# Patient Record
Sex: Male | Born: 1990
Health system: Southern US, Community
[De-identification: ages and names within clinical notes are randomized; demographics above are authoritative.]

## PROBLEM LIST (undated history)

## (undated) DIAGNOSIS — W3400XA Accidental discharge from unspecified firearms or gun, initial encounter: Secondary | ICD-10-CM

## (undated) HISTORY — PX: HERNIA REPAIR: SHX51

---
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
## 2010-05-28 ENCOUNTER — Emergency Department (HOSPITAL_BASED_OUTPATIENT_CLINIC_OR_DEPARTMENT_OTHER): Admission: EM | Admit: 2010-05-28 | Discharge: 2010-05-28 | Payer: Self-pay | Admitting: Emergency Medicine

## 2010-07-17 ENCOUNTER — Ambulatory Visit: Payer: Self-pay | Admitting: Diagnostic Radiology

## 2010-07-17 ENCOUNTER — Ambulatory Visit (HOSPITAL_BASED_OUTPATIENT_CLINIC_OR_DEPARTMENT_OTHER): Admission: RE | Admit: 2010-07-17 | Discharge: 2010-07-17 | Payer: Self-pay | Admitting: Internal Medicine

## 2010-12-08 ENCOUNTER — Emergency Department (HOSPITAL_BASED_OUTPATIENT_CLINIC_OR_DEPARTMENT_OTHER)
Admission: EM | Admit: 2010-12-08 | Discharge: 2010-12-08 | Payer: Self-pay | Source: Home / Self Care | Admitting: Emergency Medicine

## 2011-03-08 LAB — URINALYSIS, ROUTINE W REFLEX MICROSCOPIC
Bilirubin Urine: NEGATIVE
Ketones, ur: NEGATIVE mg/dL
Protein, ur: 100 mg/dL — AB
Specific Gravity, Urine: 1.012 (ref 1.005–1.030)
pH: 7.5 (ref 5.0–8.0)

## 2011-03-08 LAB — URINE MICROSCOPIC-ADD ON

## 2011-03-08 LAB — GC/CHLAMYDIA PROBE AMP, GENITAL: GC Probe Amp, Genital: NEGATIVE

## 2011-05-07 NOTE — Op Note (Signed)
   NAME:  Jacob Velazquez, Jacob Velazquez                            ACCOUNT NO.:  [id number]   MEDICAL RECORD NO.:  [id number]                   PATIENT TYPE:  AMB   LOCATION:  DSC                                  FACILITY:  [facility]   PHYSICIAN:  Prabhakar D. Pendse, M.D.           DATE OF BIRTH:  [birth year]   DATE OF PROCEDURE:  DATE OF DISCHARGE:                                 OPERATIVE REPORT   PREOPERATIVE DIAGNOSIS:  1. Right indirect inguinal hernia.  2. Status post left orchiopexy.   POSTOPERATIVE DIAGNOSIS:  1. Right indirect inguinal hernia.  2. Status post left orchiopexy.   OPERATION:  Repair of right indirect inguinal hernia.   SURGEON:  Prabhakar D. Levie Heritage, M.D.   ASSISTANT:  Nurse.   ANESTHESIA:  General.   DESCRIPTION OF PROCEDURE:  With the patient in the supine position the  abdominal and groin regions were  thoroughly prepped and draped in the usual  sterile manner. About a 3 cm long transverse incision was made in the right  groin in the distal skin crease. The skin and subcutaneous tissue was  incised and bleeder were clamped, cut and electrocoagulated.   The external oblique was opened. The spermatic cord structures were  dissected to isolate the indirect inguinal hernia sac. The sac was isolated  up to its high point and doubly suture ligated with 4-0 silk and the excess  of the sac was excised. The testicle was placed in the right scrotal pouch.   The hernia repair was carried out with modified Ferguson's method with a 35  wire into the sutures. Then 0.25% Marcaine with epinephrine was injected  locally for postoperative analgesia. The subcutaneous tissue was closed with  4-0 Vicryl. The skin was closed with 5-0 Monocryl subcuticular suture. Steri-  Strips were applied.   Throughout the patient's vital signs remained stable. The patient withstood  the procedure well and was transferred to the recovery room in satisfactory  general condition.                                     Prabhakar D. Levie Heritage, M.D.    PDP/MEDQ  D:  11/07/2002  T:  11/07/2002  Job:  [id number]   cc:   Austin Miles, M.D.  [address]  [place], [place] [address]  Fax: [phone]

## 2012-07-15 ENCOUNTER — Encounter (HOSPITAL_BASED_OUTPATIENT_CLINIC_OR_DEPARTMENT_OTHER): Payer: Self-pay | Admitting: *Deleted

## 2012-07-15 ENCOUNTER — Emergency Department (HOSPITAL_BASED_OUTPATIENT_CLINIC_OR_DEPARTMENT_OTHER)
Admission: EM | Admit: 2012-07-15 | Discharge: 2012-07-15 | Disposition: A | Discharge status: Other Acute Inpatient Hospital | Payer: Self-pay | Attending: Emergency Medicine | Admitting: Emergency Medicine

## 2012-07-15 ENCOUNTER — Emergency Department (HOSPITAL_BASED_OUTPATIENT_CLINIC_OR_DEPARTMENT_OTHER): Payer: Self-pay

## 2012-07-15 DIAGNOSIS — S025XXA Fracture of tooth (traumatic), initial encounter for closed fracture: Secondary | ICD-10-CM | POA: Insufficient documentation

## 2012-07-15 DIAGNOSIS — M2679 Other specified alveolar anomalies: Secondary | ICD-10-CM | POA: Insufficient documentation

## 2012-07-15 DIAGNOSIS — S0993XA Unspecified injury of face, initial encounter: Secondary | ICD-10-CM | POA: Insufficient documentation

## 2012-07-15 DIAGNOSIS — S199XXA Unspecified injury of neck, initial encounter: Secondary | ICD-10-CM | POA: Insufficient documentation

## 2012-07-15 DIAGNOSIS — S01501A Unspecified open wound of lip, initial encounter: Secondary | ICD-10-CM | POA: Insufficient documentation

## 2012-07-15 DIAGNOSIS — M2670 Unspecified alveolar anomaly: Secondary | ICD-10-CM

## 2012-07-15 MED ORDER — TRAMADOL HCL 50 MG PO TABS
50.0000 mg | ORAL_TABLET | Freq: Once | ORAL | Status: AC
  Administered 2012-07-15: 50 mg via ORAL
  Filled 2012-07-15: qty 1

## 2012-07-15 MED ORDER — CEFAZOLIN SODIUM 1-5 GM-% IV SOLN
1.0000 g | Freq: Once | INTRAVENOUS | Status: AC
  Administered 2012-07-15: 1 g via INTRAVENOUS
  Filled 2012-07-15: qty 50

## 2012-07-15 NOTE — ED Notes (Signed)
Dr Nicanor Alcon informed pt that she is still attempting to contact oral surgeons to make pt a follow-up appt. Pt aware and agrees with plan of care.

## 2012-07-15 NOTE — ED Notes (Signed)
MD at bedside. 
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
## 2012-07-15 NOTE — ED Notes (Signed)
Patient transported to CT 

## 2012-07-15 NOTE — ED Notes (Signed)
MD at bedside for laceration repair to upper lip.
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
## 2012-07-15 NOTE — ED Notes (Signed)
Pt return from CT in NAD. [employer] at bs.

## 2012-07-15 NOTE — ED Notes (Signed)
Pt report given to Rosanne Ashing, RN in ED at [facility]. Awaiting arrival of [facility] for pt transport.

## 2012-07-15 NOTE — ED Notes (Signed)
Pt report given to Jason, RN with [facility]. 

## 2015-04-14 ENCOUNTER — Emergency Department (HOSPITAL_BASED_OUTPATIENT_CLINIC_OR_DEPARTMENT_OTHER)
Admission: EM | Admit: 2015-04-14 | Discharge: 2015-04-14 | Disposition: A | Discharge status: Home / Self Care | Payer: Self-pay | Attending: Emergency Medicine | Admitting: Emergency Medicine

## 2015-04-14 ENCOUNTER — Encounter (HOSPITAL_BASED_OUTPATIENT_CLINIC_OR_DEPARTMENT_OTHER): Payer: Self-pay | Admitting: Emergency Medicine

## 2015-04-14 DIAGNOSIS — Z720 Tobacco use: Secondary | ICD-10-CM | POA: Insufficient documentation

## 2015-04-14 DIAGNOSIS — K029 Dental caries, unspecified: Secondary | ICD-10-CM | POA: Insufficient documentation

## 2015-04-14 MED ORDER — PENICILLIN V POTASSIUM 500 MG PO TABS
500.0000 mg | ORAL_TABLET | Freq: Four times a day (QID) | ORAL | Status: AC
Start: 2015-04-14 — End: 2015-04-21

## 2015-04-14 MED ORDER — NAPROXEN 250 MG PO TABS
500.0000 mg | ORAL_TABLET | Freq: Once | ORAL | Status: AC
  Administered 2015-04-14: 500 mg via ORAL
  Filled 2015-04-14: qty 2

## 2015-04-14 MED ORDER — PENICILLIN V POTASSIUM 250 MG PO TABS
500.0000 mg | ORAL_TABLET | Freq: Once | ORAL | Status: AC
  Administered 2015-04-14: 500 mg via ORAL
  Filled 2015-04-14: qty 2

## 2015-04-14 MED ORDER — MELOXICAM 15 MG PO TABS: 15.0000 mg | ORAL_TABLET | Freq: Every day | ORAL | Status: DC

## 2015-04-14 NOTE — Discharge Instructions (Signed)
Dental Care and Dentist Visits °Dental care supports good overall health. Regular dental visits can also help you avoid dental pain, bleeding, infection, and other more serious health problems in the future. It is important to keep the mouth healthy because diseases in the teeth, gums, and other oral tissues can spread to other areas of the body. Some problems, such as diabetes, heart disease, and pre-term labor have been associated with poor oral health.  °See your dentist every 6 months. If you experience emergency problems such as a toothache or broken tooth, go to the dentist right away. If you see your dentist regularly, you may catch problems early. It is easier to be treated for problems in the early stages.  °WHAT TO EXPECT AT A DENTIST VISIT  °Your dentist will look for many common oral health problems and recommend proper treatment. At your regular dental visit, you can expect: °· Gentle cleaning of the teeth and gums. This includes scraping and polishing. This helps to remove the sticky substance around the teeth and gums (plaque). Plaque forms in the mouth shortly after eating. Over time, plaque hardens on the teeth as tartar. If tartar is not removed regularly, it can cause problems. Cleaning also helps remove stains. °· Periodic X-rays. These pictures of the teeth and supporting bone will help your dentist assess the health of your teeth. °· Periodic fluoride treatments. Fluoride is a natural mineral shown to help strengthen teeth. Fluoride treatment involves applying a fluoride gel or varnish to the teeth. It is most commonly done in children. °· Examination of the mouth, tongue, jaws, teeth, and gums to look for any oral health problems, such as: °¨ Cavities (dental caries). This is decay on the tooth caused by plaque, sugar, and acid in the mouth. It is best to catch a cavity when it is small. °¨ Inflammation of the gums caused by plaque buildup (gingivitis). °¨ Problems with the mouth or malformed  or misaligned teeth. °¨ Oral cancer or other diseases of the soft tissues or jaws.  °KEEP YOUR TEETH AND GUMS HEALTHY °For healthy teeth and gums, follow these general guidelines as well as your dentist's specific advice: °· Have your teeth professionally cleaned at the dentist every 6 months. °· Brush twice daily with a fluoride toothpaste. °· Floss your teeth daily.  °· Ask your dentist if you need fluoride supplements, treatments, or fluoride toothpaste. °· Eat a healthy diet. Reduce foods and drinks with added sugar. °· Avoid smoking. °TREATMENT FOR ORAL HEALTH PROBLEMS °If you have oral health problems, treatment varies depending on the conditions present in your teeth and gums. °· Your caregiver will most likely recommend good oral hygiene at each visit. °· For cavities, gingivitis, or other oral health disease, your caregiver will perform a procedure to treat the problem. This is typically done at a separate appointment. Sometimes your caregiver will refer you to another dental specialist for specific tooth problems or for surgery. °SEEK IMMEDIATE DENTAL CARE IF: °· You have pain, bleeding, or soreness in the gum, tooth, jaw, or mouth area. °· A permanent tooth becomes loose or separated from the gum socket. °· You experience a blow or injury to the mouth or jaw area. °Document Released: 08/18/2011 Document Revised: 02/28/2012 Document Reviewed: 08/18/2011 °ExitCare® Patient Information ©2015 [employer]. This information is not intended to replace advice given to you by your health care provider. Make sure you discuss any questions you have with your health care provider. ° °Emergency Department Resource Guide °1) Find a Doctor   and Pay Out of Pocket °Although you won't have to find out who is covered by your insurance plan, it is a good idea to ask around and get recommendations. You will then need to call the office and see if the doctor you have chosen will accept you as a new patient and what types of  options they offer for patients who are self-pay. Some doctors offer discounts or will set up payment plans for their patients who do not have insurance, but you will need to ask so you aren't surprised when you get to your appointment. ° °2) Contact Your Local Health Department °Not all health departments have doctors that can see patients for sick visits, but many do, so it is worth a call to see if yours does. If you don't know where your local health department is, you can check in your phone book. The CDC also has a tool to help you locate your state's health department, and many state websites also have listings of all of their local health departments. ° °3) Find a Walk-in Clinic °If your illness is not likely to be very severe or complicated, you may want to try a walk in clinic. These are popping up all over the country in pharmacies, drugstores, and shopping centers. They're usually staffed by nurse practitioners or physician assistants that have been trained to treat common illnesses and complaints. They're usually fairly quick and inexpensive. However, if you have serious medical issues or chronic medical problems, these are probably not your best option. ° °No Primary Care Doctor: °- Call [facility] at  832-8000 - they can help you locate a primary care doctor that  accepts your insurance, provides certain services, etc. °- Physician Referral Service- 1-800-533-3463 ° °Chronic Pain Problems: °Organization         Address  Phone   Notes  °[facility]  (336) 297-2271 Patients need to be referred by their primary care doctor.  ° °Medication Assistance: °Organization         Address  Phone   Notes  °[place] [facility] [address] °[place], [place] [address] (336) 641-8030 --Must be a resident of [place] °-- Must have NO insurance coverage whatsoever (no Medicaid/ Medicare, etc.) °-- The pt. MUST have a primary care doctor that directs  their care regularly and follows them in the community °  °[facility]  (866) 331-1348   °[employer]  (888) 892-1162   ° °Agencies that provide inexpensive medical care: °Organization         Address  Phone   Notes  °[facility]  (336) 832-8035   °[facility]    (336) 832-7272   °[facility] [address] °[place], [place] [address] (336) 832-4777   °[facility] [address], °[place] (336) 271-4999   °[facility]    (336) 373-0678   °[facility]    (336) 272-1050   °[facility] ° [address], [place] Phone:  (336) 832-4444, Fax:  (336) 832-4440 Hours of Operation:  9 am - 6 pm, M-F.  Also accepts Medicaid/Medicare and self-pay.  °[facility] ° [address], [place] Phone: (336) 832-3150, Fax: (336) 832-3151. Hours of Operation:  8:30 am - 5:30 pm, M-F.  Also accepts Medicaid and self-pay.  °[facility] [place] [address], [place] Phone: (336) 878-6027   °  [facility] [address], [place], [place] (336)723-1848, Ext. 123 Mondays & Thursdays: 7-9 AM.  First 15 patients are seen on a first come, first serve basis. °  ° °Medicaid-accepting [place] Providers: ° °Organization         Address  Phone   Notes  °[facility] [address], [place] (336) 641-2100 Also accepts self-pay patients.  °[facility] [address], [place] ° (336) 856-9996   °[facility] [address], [place] (336) 288-8857   °[facility] [address] [place] [address], [place] (336) 299-7000   °Veita Bland [address], [place]  ° (336) 373-1557 Only accepts [place] Access Medicaid patients after they have their name applied to their card.  ° °Self-Pay (no insurance) in [place]: ° °Organization          Address  Phone   Notes  °Sickle Cell Patients, [facility] [address], [place] (336) 832-1970   °[facility] [address], [place] (336) 832-4400   °[facility] ° [address], [place] (336) 992-4800   °[facility]/Dr. Osei-Bonsu ° [address] [place] [address], [place] or [address], [place] (336) [phone] Phone number for both [place] and [place] locations is the same.  °[facility] [address], [place] (336) 299-0000   °[facility] [address] [place] [address], [place] or [address] (336) 852-7530 °(336) 878-2260   °[facility] [address], [place] (336) 350-1642, phone; (336) 294-5005, fax Sees patients 1st and 3rd Saturday of every month.  Must not qualify for public or private insurance (i.e. Medicaid, Medicare, [place] Health Choice, Veterans' Benefits) • Household income should be no more than 200% of the poverty level •The clinic cannot treat you if you are pregnant or think you are pregnant • Sexually transmitted diseases are not treated at the clinic.  ° ° °Dental Care: °Organization         Address  Phone  Notes  °[facility] [address], [place] (336) 641-6152 Accepts children up to age 21 who are enrolled in Medicaid or [place] Health Choice; pregnant women with a Medicaid card; and children who have applied for Medicaid or [place] Health Choice, but were declined, whose parents can pay a reduced fee at time of service.  °[facility] [place]  [address], [place] (336) 641-7733 Accepts children up to age 21 who are enrolled in Medicaid or [place] Health Choice; pregnant women with a Medicaid card; and children who have applied for Medicaid or [place] Health Choice, but were declined, whose parents can pay a reduced fee at time of  service.  °[facility] ° [address], [place] (336) 641-4533 Patients are seen by appointment only. Walk-ins are not accepted. [facility] will see patients 18 years of age and older. °Monday - Tuesday (8am-5pm) °Most Wednesdays (8:30-5pm) °$30 per visit, cash only  °[facility] ° [address], [place] (336) 641-4533 Patients are seen by appointment only. Walk-ins are not accepted. [facility] will see patients 18 years of age and older. °One Wednesday Evening (Monthly: Volunteer Based).  $30 per visit,   cash only  °[facility]  (919) 537-3737 for adults; Children under age 4, call Graduate Pediatric Dentistry at (919) [phone]. Children aged 4-14, please call (919) [phone] to request a pediatric application. ° Dental services are provided in all areas of dental care including fillings, crowns and bridges, complete and partial dentures, implants, gum treatment, root canals, and extractions. Preventive care is also provided. Treatment is provided to both adults and children. °Patients are selected via a lottery and there is often a waiting list. °  °[facility] [address], °[place] ° (336) 763-8833 [email or web address] °  °[facility] [address], [place], [place] (336)723-1848, Ext. 123 Second and Fourth Thursday of each month, opens at 6:30 AM; Clinic ends at 9 AM.  Patients are seen on a first-come first-served basis, and a limited number are seen during each clinic.  ° °[facility] ° [address], [place], [place] (336) 723-7904   Eligibility Requirements °You must have lived in [place], [place], or [place] counties for at least the last three months. °  You cannot be eligible for state or federal sponsored healthcare insurance, including Veterans Administration, Medicaid, or Medicare. °  You generally cannot be eligible for healthcare insurance through your employer.   °  How to apply: °Eligibility screenings are held every Tuesday and Wednesday afternoon from 1:00 pm until 4:00 pm. You do not need an appointment for the interview!  °[facility] [address], [place], [place] 336-631-2330   °[facility]  336-342-8273   °[facility]  336-703-3100   °[facility]  336-570-6415   ° °Behavioral Health Resources in the Community: °Intensive Outpatient Programs °Organization         Address  Phone  Notes  °[facility] [address], [place], [place] 336-878-6098   °[facility] [address], [place], [place] 336-832-9800   °[facility] [address], [place], [place] ° 336-882-2125   °[facility] [address],  °[place], [place] 1-800-853-5163 or 336-641-4981   °Substance Abuse Resources °Organization         Address  Phone  Notes  °[facility]  336-882-2125   °[facility]  336-784-9470   °[facility]  336-285-9073   °[facility]  336-845-3988   °Residential & Outpatient Substance Abuse Program  1-800-659-3381   °Psychological Services °Organization         Address  Phone  Notes  °[facility]  336- 832-9600   °[facility]  336- 378-7881   °[facility] [address], [place] 1-800-853-5163 or 336-641-4981   ° °Mobile Crisis Teams °Organization         Address  Phone  Notes  °[facility], Mobile Crisis Care Unit  1-877-626-1772   °Assertive °[facility] ° [address] [place], [place] 336-834-9664   °Sharon DeEsch [address] °[place] 336-554-5454   ° °Self-Help/Support Groups °Organization         Address  Phone             Notes  °[facility] - variety of support groups  336- 373-1402 Call for more information  °Narcotics Anonymous (NA), [facility] [address], °[place]  2 meetings at this location  ° °Residential Treatment Programs °Organization           Address  Phone  Notes  °[facility] [address],    °[place]  1-866-801-8205   °[facility] ° [address], [place], [place] 704-293-8524   °[facility] [address], [place] 336-845-3988 Admissions: 8am-3pm M-F  °[facility] [address]    °[place], [place] 336-841-1104   °[facility] [address], [place], [place] 336-379-7146   °[facility] [address]  °[place], [place] 336-285-9073   °[facility] - Intensive Outpatient [address], [place], [place] 336-852-3033   °[facility] [address]  °[place], [place] 1-877-615-2722 or 336-784-9470   °[facility] [address] [place], [place] 336-227-7417 Accepts Medicaid  °[facility] [address]  °[place] 1-800-659-3381 Substance Abuse/Addiction Treatment  ° °[place] Behavioral Health Resources °Organization         Address  Phone  Notes  °[facility]  (888) 581-9988   °Julie Brannon, PhD [address] [place], [place]   (336) 349-5553 or (336) 951-0000   °[facility]   [address] °[place], [place] (336) 349-4454   °[facility] [address], [place], [place] (336) 342-8316 Insurance/Medicaid/sponsorship through [facility]  °[facility] [address]                                    [place], [place] (336) 342-8316 Therapy/tele-psych/case  °[facility] [address]  ° [place],  (336) 349-2233    °Dr. Arfeen  (336) 349-4544   °[facility] [place]  [employer] [place] [facility] 1) [address], [place] °2) [address], [place] °3)  [address], [place] (336) 349-3220 °(336) 342-7768 ° °(336) 342-8140   °[place] [facility]  Hotline (336) 342-1394 or (336) 342-3537 (After Hours)    ° °

## 2015-04-14 NOTE — ED Notes (Signed)
Pt reports  Dental pain intermittant for a long time but flare up of pain x 1 week this episode of upper right wisdom tooth

## 2015-04-14 NOTE — ED Provider Notes (Signed)
CSN: [id number]     Arrival date & time 04/14/15  0054 History   First MD Initiated Contact with Patient 04/14/15 0241     Chief Complaint  Patient presents with  . Dental Pain     (Consider location/radiation/quality/duration/timing/severity/associated sxs/prior Treatment) Patient is a 24 y.o. male presenting with tooth pain. The history is provided by the patient.  Dental Pain Location:  Upper Upper teeth location:  1/RU 3rd molar Quality:  Dull Severity:  Severe Onset quality:  Sudden Duration:  2 days Timing:  Constant Progression:  Unchanged Context: not trauma   Previous work-up:  Dental exam Relieved by:  Nothing Worsened by:  Nothing tried Ineffective treatments:  None tried Associated symptoms: no drooling, no fever and no trismus   Risk factors: smoking     History reviewed. No pertinent past medical history. Past Surgical History  Procedure Laterality Date  . Hernia repair     History reviewed. No pertinent family history. History  Substance Use Topics  . Smoking status: Current Every Day Smoker  . Smokeless tobacco: Not on file  . Alcohol Use: Yes    Review of Systems  Constitutional: Negative for fever.  HENT: Negative for drooling.   All other systems reviewed and are negative.     Allergies  Review of patient's allergies indicates no known allergies.  Home Medications   Prior to Admission medications   Medication Sig Start Date End Date Taking? Authorizing Provider  meloxicam (MOBIC) 15 MG tablet Take 1 tablet (15 mg total) by mouth daily. 04/14/15   Darriel Utter, MD  penicillin v potassium (VEETID) 500 MG tablet Take 1 tablet (500 mg total) by mouth 4 (four) times daily. 04/14/15 04/21/15  Schelly Chuba, MD   BP 133/69 mmHg  Pulse 81  Temp(Src) 98.7 F (37.1 C) (Oral)  Resp 18  SpO2 98% Physical Exam  Constitutional: He is oriented to person, place, and time. He appears well-developed and well-nourished. No distress.  HENT:  Head:  Normocephalic and atraumatic.  Mouth/Throat: Oropharynx is clear and moist. No trismus in the jaw. No oropharyngeal exudate.    Eyes: Conjunctivae and EOM are normal. Pupils are equal, round, and reactive to light.  Neck: Normal range of motion. Neck supple.  Cardiovascular: Normal rate, regular rhythm and intact distal pulses.   Pulmonary/Chest: Effort normal and breath sounds normal. No respiratory distress. He has no wheezes. He has no rales.  Abdominal: Soft. Bowel sounds are normal. There is no tenderness. There is no rebound and no guarding.  Musculoskeletal: Normal range of motion.  Lymphadenopathy:    He has no cervical adenopathy.  Neurological: He is alert and oriented to person, place, and time.  Skin: Skin is warm and dry.  Psychiatric: He has a normal mood and affect.    ED Course  Procedures (including critical care time) Labs Review Labs Reviewed - No data to display  Imaging Review No results found.   EKG Interpretation None      MDM   Final diagnoses:  Dental caries    Follow up with dentistry penicillin and mobic    Arcenio Mullaly, MD 04/14/15 [phone]

## 2016-08-04 ENCOUNTER — Emergency Department (HOSPITAL_BASED_OUTPATIENT_CLINIC_OR_DEPARTMENT_OTHER)
Admission: EM | Admit: 2016-08-04 | Discharge: 2016-08-05 | Disposition: A | Discharge status: Home / Self Care | Payer: Self-pay | Attending: Emergency Medicine | Admitting: Emergency Medicine

## 2016-08-04 ENCOUNTER — Encounter (HOSPITAL_BASED_OUTPATIENT_CLINIC_OR_DEPARTMENT_OTHER): Payer: Self-pay | Admitting: *Deleted

## 2016-08-04 DIAGNOSIS — Z791 Long term (current) use of non-steroidal anti-inflammatories (NSAID): Secondary | ICD-10-CM | POA: Insufficient documentation

## 2016-08-04 DIAGNOSIS — F17200 Nicotine dependence, unspecified, uncomplicated: Secondary | ICD-10-CM | POA: Insufficient documentation

## 2016-08-04 DIAGNOSIS — K029 Dental caries, unspecified: Secondary | ICD-10-CM | POA: Insufficient documentation

## 2016-08-04 NOTE — ED Notes (Signed)
Poison control notified of overdose, no suggestions or concerns

## 2016-08-04 NOTE — ED Triage Notes (Addendum)
Pt states he took flexeril 10mg  x 2 tabs and vicodin 5 mg x 1 tab and motrin 800 mg x 1 tab 2 hrs ago for dental pain , denies any symptoms just worried he took tomuch

## 2016-08-05 MED ORDER — HYDROCODONE-ACETAMINOPHEN 5-325 MG PO TABS: 1.0000 | ORAL_TABLET | Freq: Four times a day (QID) | ORAL | 0 refills | Status: DC | PRN

## 2016-08-05 MED ORDER — AMOXICILLIN 500 MG PO CAPS: 500.0000 mg | ORAL_CAPSULE | Freq: Three times a day (TID) | ORAL | 0 refills | Status: DC

## 2016-08-05 NOTE — Discharge Instructions (Signed)
Take acetaminophen and ibuprofen for your main pain control. See a dentist as soon as possible - your teeth will not get better on their own.

## 2016-08-05 NOTE — ED Notes (Signed)
MD at bedside. 

## 2016-08-05 NOTE — ED Provider Notes (Signed)
[facility]   CSN: [id number] Arrival date & time: 08/04/16  2328     History   Chief Complaint Chief Complaint  Patient presents with  . Drug Overdose    HPI Jacob Velazquez is a 25 y.o. male.  The history is provided by the patient.  Drug Overdose   He has been having pain in the left lower molar for about one month. He was in severe pain tonight, so he took some medication which had been prescribed for his back. He took ibuprofen 800 mg, cyclobenzaprine 20 mg (210 mg tablets), and hydrocodone-acetaminophen 5-325. This was done at about 8 PM. He then was concerned that he may taken too much medication so he came to the ED. He has not made an appointment with a dentist but was planning to try to be seen as a walk-in tomorrow.  History reviewed. No pertinent past medical history.  There are no active problems to display for this patient.   Past Surgical History:  Procedure Laterality Date  . HERNIA REPAIR         Home Medications    Prior to Admission medications   Medication Sig Start Date End Date Taking? Authorizing Provider  cyclobenzaprine (FLEXERIL) 10 MG tablet Take 10 mg by mouth 3 (three) times daily as needed for muscle spasms.   Yes Historical Provider, MD  HYDROcodone-acetaminophen (NORCO/VICODIN) 5-325 MG tablet Take 1 tablet by mouth every 6 (six) hours as needed for moderate pain.   Yes Historical Provider, MD  ibuprofen (ADVIL,MOTRIN) 800 MG tablet Take 800 mg by mouth every 8 (eight) hours as needed.   Yes Historical Provider, MD    Family History No family history on file.  Social History Social History  Substance Use Topics  . Smoking status: Current Every Day Smoker    Packs/day: 0.50  . Smokeless tobacco: Not on file  . Alcohol use Yes     Allergies   Review of patient's allergies indicates no known allergies.   Review of Systems Review of Systems  All other systems reviewed and are negative.    Physical  Exam Updated Vital Signs BP 125/83   Pulse 70   Temp 98.2 F (36.8 C) (Oral)   Resp 16   Ht 5\' 11"  (1.803 m)   Wt 145 lb (65.8 kg)   SpO2 100%   BMI 20.22 kg/m   Physical Exam  Nursing note and vitals reviewed.  25 year old male, resting comfortably and in no acute distress. Vital signs are normal. Oxygen saturation is 100%, which is normal. Head is normocephalic and atraumatic. PERRLA, EOMI. Oropharynx is clear. Neck is nontender and supple without adenopathy or JVD. Tooth #32 is severely carious and rotted down to about the gingival line. There is also a moderate size Tennille carry on tooth #31. Back is nontender and there is no CVA tenderness. Lungs are clear without rales, wheezes, or rhonchi. Chest is nontender. Heart has regular rate and rhythm without murmur. Abdomen is soft, flat, nontender without masses or hepatosplenomegaly and peristalsis is normoactive. Extremities have no cyanosis or edema, full range of motion is present. Skin is warm and dry without rash. Neurologic: He is sleepy but arousable, cranial nerves are intact, there are no motor or sensory deficits.  ED Treatments / Results   Procedures Procedures (including critical care time)  Medications Ordered in ED Medications - No data to display   Initial Impression / Assessment and Plan / ED Course  I  have reviewed the triage vital signs and the nursing notes.  Pertinent labs & imaging results that were available during my care of the patient were reviewed by me and considered in my medical decision making (see chart for details).  Clinical Course    Overmedication for dental pain. The only medication that was taken above the recommended dose was cyclobenzaprine. Poison control was consulted and there are no concerns for the medication she took in the amount that he took. No indication for laboratory testing and no need for observation. He is supposed to go to work at 5 AM and I am concerned that he  may be too drowsy because of the cyclobenzaprine that he took. He is given a work note. He is given dental resources and advised that he will need the services of a dentist to adequately address his pain. He is given a prescription for amoxicillin. The hydrocodone tablet he took was his last one. Is given a prescription for 10 more.  Final Clinical Impressions(s) / ED Diagnoses   Final diagnoses:  Dental caries    New Prescriptions Discharge Medication List as of 08/05/2016 12:44 AM    START taking these medications   Details  amoxicillin (AMOXIL) 500 MG capsule Take 1 capsule (500 mg total) by mouth 3 (three) times daily., Starting Thu 08/05/2016, Print         Dione Boozeavid Alim Cattell, MD 08/05/16 [phone]

## 2017-09-12 ENCOUNTER — Emergency Department (HOSPITAL_BASED_OUTPATIENT_CLINIC_OR_DEPARTMENT_OTHER)
Admission: EM | Admit: 2017-09-12 | Discharge: 2017-09-12 | Disposition: A | Discharge status: Home / Self Care | Payer: Self-pay | Attending: Emergency Medicine | Admitting: Emergency Medicine

## 2017-09-12 ENCOUNTER — Encounter (HOSPITAL_BASED_OUTPATIENT_CLINIC_OR_DEPARTMENT_OTHER): Payer: Self-pay | Admitting: Emergency Medicine

## 2017-09-12 DIAGNOSIS — F17200 Nicotine dependence, unspecified, uncomplicated: Secondary | ICD-10-CM | POA: Insufficient documentation

## 2017-09-12 DIAGNOSIS — Z202 Contact with and (suspected) exposure to infections with a predominantly sexual mode of transmission: Secondary | ICD-10-CM | POA: Insufficient documentation

## 2017-09-12 NOTE — ED Provider Notes (Signed)
[facility]   CSN: [id number] Arrival date & time: 09/12/17  1003     History   Chief Complaint Chief Complaint  Patient presents with  . Exposure to STD    HPI Jacob Velazquez is a 26 y.o. male.  HPI Patient said 26 year male who presents to the emergency department reports that he needs to be checked for STDs.  He is without urethral symptoms.  No discharge.  No abdominal pain.  Denies nausea vomiting.  His significant other is in the emergency department being seen for new vaginal discharge and vaginal odor.   History reviewed. No pertinent past medical history.  There are no active problems to display for this patient.   Past Surgical History:  Procedure Laterality Date  . HERNIA REPAIR         Home Medications    Prior to Admission medications   Medication Sig Start Date End Date Taking? Authorizing Provider  amoxicillin (AMOXIL) 500 MG capsule Take 1 capsule (500 mg total) by mouth 3 (three) times daily. 08/05/16   Dione Booze, MD  HYDROcodone-acetaminophen (NORCO/VICODIN) 5-325 MG tablet Take 1 tablet by mouth every 6 (six) hours as needed for moderate pain. 08/05/16   Dione Booze, MD  ibuprofen (ADVIL,MOTRIN) 800 MG tablet Take 800 mg by mouth every 8 (eight) hours as needed.    [provider]    Family History No family history on file.  Social History Social History  Substance Use Topics  . Smoking status: Current Every Day Smoker    Packs/day: 0.50  . Smokeless tobacco: Never Used  . Alcohol use Yes     Allergies   Patient has no known allergies.   Review of Systems Review of Systems  All other systems reviewed and are negative.    Physical Exam Updated Vital Signs BP 132/88 (BP Location: Right Arm)   Pulse 63   Temp 97.6 F (36.4 C) (Oral)   Resp 18   Ht 6' (1.829 m)   Wt 74.8 kg (165 lb)   SpO2 100%   BMI 22.38 kg/m   Physical Exam  Constitutional: He is oriented to person, place, and time.  He appears well-developed and well-nourished.  HENT:  Head: Normocephalic.  Eyes: EOM are normal.  Neck: Normal range of motion.  Cardiovascular: Normal rate.   Pulmonary/Chest: Effort normal.  Abdominal: He exhibits no distension.  Musculoskeletal: Normal range of motion.  Neurological: He is alert and oriented to person, place, and time.  Psychiatric: He has a normal mood and affect.  Nursing note and vitals reviewed.    ED Treatments / Results  Labs (all labs ordered are listed, but only abnormal results are displayed) Labs Reviewed - No data to display  EKG  EKG Interpretation None       Radiology No results found.  Procedures Procedures (including critical care time)  Medications Ordered in ED Medications - No data to display   Initial Impression / Assessment and Plan / ED Course  I have reviewed the triage vital signs and the nursing notes.  Pertinent labs & imaging results that were available during my care of the patient were reviewed by me and considered in my medical decision making (see chart for details).     Medical screening examination completed.  No life-threatening emergency.  Patient referred to the [facility] department for STD screening  Final Clinical Impressions(s) / ED Diagnoses   Final diagnoses:  Possible exposure to STD  New Prescriptions New Prescriptions   No medications on file     Azalia Bilis, MD 09/12/17 1037

## 2017-09-12 NOTE — ED Triage Notes (Signed)
Pt reports he would like an STD check, no exposure, no symptoms. Pt states he likes to get checked every few months and the health dept takes too long.

## 2019-04-05 ENCOUNTER — Emergency Department (HOSPITAL_COMMUNITY)
Admission: EM | Admit: 2019-04-05 | Discharge: 2019-04-06 | Disposition: A | Discharge status: Home / Self Care | Payer: Self-pay | Attending: Emergency Medicine | Admitting: Emergency Medicine

## 2019-04-05 ENCOUNTER — Other Ambulatory Visit: Payer: Self-pay

## 2019-04-05 ENCOUNTER — Emergency Department (HOSPITAL_COMMUNITY): Payer: Self-pay

## 2019-04-05 ENCOUNTER — Encounter (HOSPITAL_COMMUNITY): Payer: Self-pay

## 2019-04-05 DIAGNOSIS — W3400XA Accidental discharge from unspecified firearms or gun, initial encounter: Secondary | ICD-10-CM | POA: Insufficient documentation

## 2019-04-05 DIAGNOSIS — Z23 Encounter for immunization: Secondary | ICD-10-CM | POA: Insufficient documentation

## 2019-04-05 DIAGNOSIS — R7309 Other abnormal glucose: Secondary | ICD-10-CM | POA: Insufficient documentation

## 2019-04-05 DIAGNOSIS — S42352A Displaced comminuted fracture of shaft of humerus, left arm, initial encounter for closed fracture: Secondary | ICD-10-CM | POA: Insufficient documentation

## 2019-04-05 DIAGNOSIS — Y249XXA Unspecified firearm discharge, undetermined intent, initial encounter: Secondary | ICD-10-CM

## 2019-04-05 DIAGNOSIS — Y999 Unspecified external cause status: Secondary | ICD-10-CM | POA: Insufficient documentation

## 2019-04-05 DIAGNOSIS — Y929 Unspecified place or not applicable: Secondary | ICD-10-CM | POA: Insufficient documentation

## 2019-04-05 DIAGNOSIS — S42352B Displaced comminuted fracture of shaft of humerus, left arm, initial encounter for open fracture: Secondary | ICD-10-CM

## 2019-04-05 DIAGNOSIS — F1290 Cannabis use, unspecified, uncomplicated: Secondary | ICD-10-CM | POA: Insufficient documentation

## 2019-04-05 DIAGNOSIS — Y939 Activity, unspecified: Secondary | ICD-10-CM | POA: Insufficient documentation

## 2019-04-05 HISTORY — DX: Unspecified firearm discharge, undetermined intent, initial encounter: Y24.9XXA

## 2019-04-05 HISTORY — DX: Accidental discharge from unspecified firearms or gun, initial encounter: W34.00XA

## 2019-04-05 LAB — CBC
HCT: 44.1 % (ref 39.0–52.0)
Hemoglobin: 14.4 g/dL (ref 13.0–17.0)
MCH: 29.9 pg (ref 26.0–34.0)
MCHC: 32.7 g/dL (ref 30.0–36.0)
MCV: 91.7 fL (ref 80.0–100.0)
Platelets: 165 10*3/uL (ref 150–400)
RBC: 4.81 MIL/uL (ref 4.22–5.81)
RDW: 12.8 % (ref 11.5–15.5)
WBC: 6.7 10*3/uL (ref 4.0–10.5)
nRBC: 0 % (ref 0.0–0.2)

## 2019-04-05 LAB — COMPREHENSIVE METABOLIC PANEL
ALT: 18 U/L (ref 0–44)
AST: 23 U/L (ref 15–41)
Albumin: 4.1 g/dL (ref 3.5–5.0)
Alkaline Phosphatase: 56 U/L (ref 38–126)
Anion gap: 14 (ref 5–15)
BUN: 12 mg/dL (ref 6–20)
CO2: 20 mmol/L — ABNORMAL LOW (ref 22–32)
Calcium: 9.1 mg/dL (ref 8.9–10.3)
Chloride: 105 mmol/L (ref 98–111)
Creatinine, Ser: 1.19 mg/dL (ref 0.61–1.24)
GFR calc Af Amer: >60 mL/min (ref >60)
GFR calc non Af Amer: >60 mL/min (ref >60)
Glucose, Bld: 163 mg/dL — ABNORMAL HIGH (ref 70–99)
Potassium: 3.6 mmol/L (ref 3.5–5.1)
Sodium: 139 mmol/L (ref 135–145)
Total Bilirubin: 0.5 mg/dL (ref 0.3–1.2)
Total Protein: 6.6 g/dL (ref 6.5–8.1)

## 2019-04-05 LAB — URINALYSIS, ROUTINE W REFLEX MICROSCOPIC
Bilirubin Urine: NEGATIVE
Glucose, UA: NEGATIVE mg/dL
Hgb urine dipstick: NEGATIVE
Ketones, ur: NEGATIVE mg/dL
Leukocytes,Ua: NEGATIVE
Nitrite: NEGATIVE
Protein, ur: NEGATIVE mg/dL
Specific Gravity, Urine: 1.043 — ABNORMAL HIGH (ref 1.005–1.030)
pH: 6 (ref 5.0–8.0)

## 2019-04-05 LAB — PROTIME-INR
INR: 1.2 (ref 0.8–1.2)
Prothrombin Time: 15 seconds (ref 11.4–15.2)

## 2019-04-05 LAB — SAMPLE TO BLOOD BANK

## 2019-04-05 LAB — LACTIC ACID, PLASMA: Lactic Acid, Venous: 4.5 mmol/L (ref 0.5–1.9)

## 2019-04-05 LAB — ETHANOL: Alcohol, Ethyl (B): <10 mg/dL (ref <10)

## 2019-04-05 LAB — CDS SEROLOGY

## 2019-04-05 MED ORDER — MORPHINE SULFATE (PF) 4 MG/ML IV SOLN
4.0000 mg | Freq: Once | INTRAVENOUS | Status: AC
  Administered 2019-04-05: 4 mg via INTRAVENOUS
  Filled 2019-04-05: qty 1

## 2019-04-05 MED ORDER — SODIUM CHLORIDE 0.9 % IV BOLUS
1000.0000 mL | Freq: Once | INTRAVENOUS | Status: AC
  Administered 2019-04-05: 1000 mL via INTRAVENOUS

## 2019-04-05 MED ORDER — IOHEXOL 350 MG/ML SOLN
100.0000 mL | Freq: Once | INTRAVENOUS | Status: AC | PRN
  Administered 2019-04-05: 100 mL via INTRAVENOUS

## 2019-04-05 MED ORDER — CEFAZOLIN SODIUM-DEXTROSE 2-4 GM/100ML-% IV SOLN
2.0000 g | Freq: Once | INTRAVENOUS | Status: AC
  Administered 2019-04-05: 2 g via INTRAVENOUS
  Filled 2019-04-05: qty 100

## 2019-04-05 MED ORDER — TETANUS-DIPHTH-ACELL PERTUSSIS 5-2.5-18.5 LF-MCG/0.5 IM SUSP
0.5000 mL | Freq: Once | INTRAMUSCULAR | Status: AC
  Administered 2019-04-05: 0.5 mL via INTRAMUSCULAR
  Filled 2019-04-05: qty 0.5

## 2019-04-05 NOTE — ED Notes (Signed)
Pt friend states that girlfriend of pt can pick him up at discharge
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
## 2019-04-06 ENCOUNTER — Encounter (HOSPITAL_BASED_OUTPATIENT_CLINIC_OR_DEPARTMENT_OTHER): Payer: Self-pay | Admitting: *Deleted

## 2019-04-06 ENCOUNTER — Other Ambulatory Visit: Payer: Self-pay

## 2019-04-06 ENCOUNTER — Encounter (HOSPITAL_BASED_OUTPATIENT_CLINIC_OR_DEPARTMENT_OTHER): Payer: Self-pay | Admitting: Emergency Medicine

## 2019-04-06 MED ORDER — CEPHALEXIN 500 MG PO CAPS: 500.0000 mg | ORAL_CAPSULE | Freq: Four times a day (QID) | ORAL | 0 refills | Status: DC

## 2019-04-06 MED ORDER — OXYCODONE-ACETAMINOPHEN 5-325 MG PO TABS: 2.0000 | ORAL_TABLET | ORAL | 0 refills | Status: DC | PRN
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
## 2019-04-06 NOTE — ED Notes (Signed)
Belongings returned from security

## 2019-04-06 NOTE — Discharge Instructions (Addendum)
Please return for any problem. Follow up as instructed below.   You must follow up with Dr. Everardo Pacific (orthopedics) tomorrow morning. He can see you at 7:30 at his office ([address]) tomorrow morning. You will need operative repair of the broken bone in your left upper arm (the humerus).   Your glucose (blood sugar) was a little hight today - 163. That could be from early diabetes. You should have your blood sugar checked in the next 1-2 weeks, and it should be monitored on an ongoing basis.

## 2019-04-08 NOTE — Anesthesia Preprocedure Evaluation (Addendum)
Anesthesia Evaluation  Patient identified by MRN, date of birth, ID band Patient awake    Reviewed: Allergy & Precautions, NPO status , Patient's Chart, lab work & pertinent test results  Airway Mallampati: II  TM Distance: >3 FB Neck ROM: Full    Dental no notable dental hx. (+) Teeth Intact, Dental Advisory Given   Pulmonary Current Smoker,    Pulmonary exam normal breath sounds clear to auscultation       Cardiovascular Exercise Tolerance: Good negative cardio ROS Normal cardiovascular exam Rhythm:Regular Rate:Normal     Neuro/Psych negative neurological ROS  negative psych ROS   GI/Hepatic negative GI ROS, Neg liver ROS,   Endo/Other  negative endocrine ROS  Renal/GU negative Renal ROS  negative genitourinary   Musculoskeletal negative musculoskeletal ROS (+)   Abdominal   Peds negative pediatric ROS (+)  Hematology negative hematology ROS (+)   Anesthesia Other Findings   Reproductive/Obstetrics negative OB ROS                           Anesthesia Physical Anesthesia Plan  ASA: II  Anesthesia Plan: General   Post-op Pain Management:  Regional for Post-op pain   Induction: Intravenous  PONV Risk Score and Plan: Ondansetron, Treatment may vary due to age or medical condition and Dexamethasone  Airway Management Planned: Oral ETT  Additional Equipment:   Intra-op Plan:   Post-operative Plan: Extubation in OR  Informed Consent: I have reviewed the patients History and Physical, chart, labs and discussed the procedure including the risks, benefits and alternatives for the proposed anesthesia with the patient or authorized representative who has indicated his/her understanding and acceptance.     Dental advisory given  Plan Discussed with: CRNA and Surgeon  Anesthesia Plan Comments:         Anesthesia Quick Evaluation

## 2019-04-09 ENCOUNTER — Other Ambulatory Visit: Payer: Self-pay

## 2019-04-09 ENCOUNTER — Encounter (HOSPITAL_BASED_OUTPATIENT_CLINIC_OR_DEPARTMENT_OTHER)
Admission: RE | Disposition: A | Discharge status: Home / Self Care | Payer: Self-pay | Source: Home / Self Care | Attending: Orthopaedic Surgery

## 2019-04-09 ENCOUNTER — Encounter (HOSPITAL_BASED_OUTPATIENT_CLINIC_OR_DEPARTMENT_OTHER): Payer: Self-pay | Admitting: Anesthesiology

## 2019-04-09 ENCOUNTER — Ambulatory Visit (HOSPITAL_BASED_OUTPATIENT_CLINIC_OR_DEPARTMENT_OTHER): Payer: Self-pay | Admitting: Anesthesiology

## 2019-04-09 ENCOUNTER — Ambulatory Visit (HOSPITAL_BASED_OUTPATIENT_CLINIC_OR_DEPARTMENT_OTHER)
Admission: RE | Admit: 2019-04-09 | Discharge: 2019-04-09 | Disposition: A | Discharge status: Home / Self Care | Payer: Self-pay | Attending: Orthopaedic Surgery | Admitting: Orthopaedic Surgery

## 2019-04-09 DIAGNOSIS — S42402B Unspecified fracture of lower end of left humerus, initial encounter for open fracture: Secondary | ICD-10-CM | POA: Insufficient documentation

## 2019-04-09 DIAGNOSIS — W3400XA Accidental discharge from unspecified firearms or gun, initial encounter: Secondary | ICD-10-CM | POA: Insufficient documentation

## 2019-04-09 DIAGNOSIS — S5402XA Injury of ulnar nerve at forearm level, left arm, initial encounter: Secondary | ICD-10-CM | POA: Insufficient documentation

## 2019-04-09 DIAGNOSIS — F17210 Nicotine dependence, cigarettes, uncomplicated: Secondary | ICD-10-CM | POA: Insufficient documentation

## 2019-04-09 DIAGNOSIS — S42309A Unspecified fracture of shaft of humerus, unspecified arm, initial encounter for closed fracture: Secondary | ICD-10-CM

## 2019-04-09 HISTORY — PX: ULNAR NERVE TRANSPOSITION: SHX2595

## 2019-04-09 HISTORY — DX: Accidental discharge from unspecified firearms or gun, initial encounter: W34.00XA

## 2019-04-09 HISTORY — PX: ORIF HUMERUS FRACTURE: SHX2126

## 2019-04-09 HISTORY — PX: INCISION AND DRAINAGE OF WOUND: SHX1803

## 2019-04-09 HISTORY — DX: Unspecified fracture of shaft of humerus, unspecified arm, initial encounter for closed fracture: S42.309A

## 2019-04-09 SURGERY — OPEN REDUCTION INTERNAL FIXATION (ORIF) HUMERAL SHAFT FRACTURE
Anesthesia: General | Laterality: Left

## 2019-04-09 MED ORDER — PROMETHAZINE HCL 25 MG/ML IJ SOLN: 6.2500 mg | INTRAMUSCULAR | Status: DC | PRN

## 2019-04-09 MED ORDER — ARTIFICIAL TEARS OPHTHALMIC OINT
TOPICAL_OINTMENT | OPHTHALMIC | Status: AC
  Filled 2019-04-09: qty 3.5

## 2019-04-09 MED ORDER — MIDAZOLAM HCL 2 MG/2ML IJ SOLN
1.0000 mg | INTRAMUSCULAR | Status: DC | PRN
  Administered 2019-04-09: 07:00:00 2 mg via INTRAVENOUS

## 2019-04-09 MED ORDER — ACETAMINOPHEN 500 MG PO TABS: 1000.0000 mg | ORAL_TABLET | Freq: Three times a day (TID) | ORAL | 0 refills | Status: AC

## 2019-04-09 MED ORDER — KETOROLAC TROMETHAMINE 30 MG/ML IJ SOLN: 30.0000 mg | Freq: Once | INTRAMUSCULAR | Status: DC | PRN

## 2019-04-09 MED ORDER — FENTANYL CITRATE (PF) 100 MCG/2ML IJ SOLN
50.0000 ug | INTRAMUSCULAR | Status: AC | PRN
  Administered 2019-04-09: 50 ug via INTRAVENOUS
  Administered 2019-04-09: 07:00:00 100 ug via INTRAVENOUS
  Administered 2019-04-09: 50 ug via INTRAVENOUS

## 2019-04-09 MED ORDER — CHLORHEXIDINE GLUCONATE 4 % EX LIQD: 60.0000 mL | Freq: Once | CUTANEOUS | Status: DC

## 2019-04-09 MED ORDER — CEFAZOLIN SODIUM-DEXTROSE 2-4 GM/100ML-% IV SOLN
INTRAVENOUS | Status: AC
  Filled 2019-04-09: qty 100

## 2019-04-09 MED ORDER — DEXAMETHASONE SODIUM PHOSPHATE 10 MG/ML IJ SOLN
INTRAMUSCULAR | Status: AC
  Filled 2019-04-09: qty 1

## 2019-04-09 MED ORDER — CEPHALEXIN 500 MG PO CAPS: 500.0000 mg | ORAL_CAPSULE | Freq: Four times a day (QID) | ORAL | 0 refills | Status: DC

## 2019-04-09 MED ORDER — SUGAMMADEX SODIUM 200 MG/2ML IV SOLN
INTRAVENOUS | Status: DC | PRN
  Administered 2019-04-09: 100 mg via INTRAVENOUS

## 2019-04-09 MED ORDER — ROCURONIUM BROMIDE 10 MG/ML (PF) SYRINGE
PREFILLED_SYRINGE | INTRAVENOUS | Status: DC | PRN
  Administered 2019-04-09: 50 mg via INTRAVENOUS

## 2019-04-09 MED ORDER — MIDAZOLAM HCL 2 MG/2ML IJ SOLN
INTRAMUSCULAR | Status: AC
  Filled 2019-04-09: qty 2

## 2019-04-09 MED ORDER — VANCOMYCIN HCL 1000 MG IV SOLR
INTRAVENOUS | Status: AC
  Filled 2019-04-09: qty 1000

## 2019-04-09 MED ORDER — ONDANSETRON HCL 4 MG/2ML IJ SOLN
INTRAMUSCULAR | Status: AC
  Filled 2019-04-09: qty 2

## 2019-04-09 MED ORDER — ONDANSETRON HCL 4 MG PO TABS: 4.0000 mg | ORAL_TABLET | Freq: Three times a day (TID) | ORAL | 1 refills | Status: AC | PRN

## 2019-04-09 MED ORDER — CLONIDINE HCL (ANALGESIA) 100 MCG/ML EP SOLN
EPIDURAL | Status: DC | PRN
  Administered 2019-04-09: 70 ug

## 2019-04-09 MED ORDER — BUPIVACAINE-EPINEPHRINE (PF) 0.5% -1:200000 IJ SOLN
INTRAMUSCULAR | Status: DC | PRN
  Administered 2019-04-09: 25 mL via PERINEURAL

## 2019-04-09 MED ORDER — PROPOFOL 10 MG/ML IV BOLUS
INTRAVENOUS | Status: DC | PRN
  Administered 2019-04-09: 150 mg via INTRAVENOUS

## 2019-04-09 MED ORDER — SCOPOLAMINE 1 MG/3DAYS TD PT72: 1.0000 | MEDICATED_PATCH | Freq: Once | TRANSDERMAL | Status: DC | PRN

## 2019-04-09 MED ORDER — FENTANYL CITRATE (PF) 100 MCG/2ML IJ SOLN
INTRAMUSCULAR | Status: AC
  Filled 2019-04-09: qty 2

## 2019-04-09 MED ORDER — VANCOMYCIN HCL 1000 MG IV SOLR
INTRAVENOUS | Status: DC | PRN
  Administered 2019-04-09: 1000 mg

## 2019-04-09 MED ORDER — ONDANSETRON HCL 4 MG/2ML IJ SOLN
INTRAMUSCULAR | Status: DC | PRN
  Administered 2019-04-09: 4 mg via INTRAVENOUS

## 2019-04-09 MED ORDER — LIDOCAINE 2% (20 MG/ML) 5 ML SYRINGE
INTRAMUSCULAR | Status: DC | PRN
  Administered 2019-04-09: 100 mg via INTRAVENOUS

## 2019-04-09 MED ORDER — MELOXICAM 7.5 MG PO TABS: 7.5000 mg | ORAL_TABLET | Freq: Every day | ORAL | 2 refills | Status: DC

## 2019-04-09 MED ORDER — LACTATED RINGERS IV SOLN
INTRAVENOUS | Status: DC
  Administered 2019-04-09: 07:00:00 via INTRAVENOUS

## 2019-04-09 MED ORDER — LIDOCAINE 2% (20 MG/ML) 5 ML SYRINGE
INTRAMUSCULAR | Status: AC
  Filled 2019-04-09: qty 5

## 2019-04-09 MED ORDER — OXYCODONE HCL 5 MG/5ML PO SOLN: 5.0000 mg | Freq: Once | ORAL | Status: DC | PRN

## 2019-04-09 MED ORDER — ROCURONIUM BROMIDE 50 MG/5ML IV SOSY
PREFILLED_SYRINGE | INTRAVENOUS | Status: AC
  Filled 2019-04-09: qty 5

## 2019-04-09 MED ORDER — CEFAZOLIN SODIUM-DEXTROSE 2-4 GM/100ML-% IV SOLN
2.0000 g | INTRAVENOUS | Status: AC
  Administered 2019-04-09: 08:00:00 2 g via INTRAVENOUS

## 2019-04-09 MED ORDER — DEXAMETHASONE SODIUM PHOSPHATE 10 MG/ML IJ SOLN
INTRAMUSCULAR | Status: DC | PRN
  Administered 2019-04-09: 10 mg via INTRAVENOUS

## 2019-04-09 MED ORDER — HYDROMORPHONE HCL 1 MG/ML IJ SOLN: 0.2500 mg | INTRAMUSCULAR | Status: DC | PRN

## 2019-04-09 MED ORDER — PROPOFOL 10 MG/ML IV BOLUS
INTRAVENOUS | Status: AC
  Filled 2019-04-09: qty 40

## 2019-04-09 MED ORDER — OXYCODONE HCL 5 MG PO TABS: ORAL_TABLET | ORAL | 0 refills | Status: AC

## 2019-04-09 MED ORDER — LIDOCAINE-EPINEPHRINE (PF) 1.5 %-1:200000 IJ SOLN
INTRAMUSCULAR | Status: DC | PRN
  Administered 2019-04-09: 15 mL via PERINEURAL

## 2019-04-09 MED ORDER — OXYCODONE HCL 5 MG PO TABS: 5.0000 mg | ORAL_TABLET | Freq: Once | ORAL | Status: DC | PRN

## 2019-04-09 MED ORDER — SUGAMMADEX SODIUM 200 MG/2ML IV SOLN
INTRAVENOUS | Status: AC
  Filled 2019-04-09: qty 2

## 2019-04-09 SURGICAL SUPPLY — 82 items
BANDAGE ACE 4X5 VEL STRL LF (GAUZE/BANDAGES/DRESSINGS) ×3 IMPLANT
BENZOIN TINCTURE PRP APPL 2/3 (GAUZE/BANDAGES/DRESSINGS) IMPLANT
BIT DRILL 2.0 (BIT) ×1
BIT DRILL 2.0MM (BIT) ×1
BIT DRILL 2.5X2.75 QC CALB (BIT) ×3 IMPLANT
BIT DRILL 2XNS DISP SS SM FRAG (BIT) ×1 IMPLANT
BIT DRILL CALIBRATED 2.7 (BIT) ×2 IMPLANT
BIT DRILL CALIBRATED 2.7MM (BIT) ×1
BIT DRL 2XNS DISP SS SM FRAG (BIT) ×1
BLADE HEX COATED 2.75 (ELECTRODE) ×3 IMPLANT
BLADE SURG 10 STRL SS (BLADE) ×3 IMPLANT
BLADE SURG 15 STRL LF DISP TIS (BLADE) ×1 IMPLANT
BLADE SURG 15 STRL SS (BLADE) ×2
BNDG ESMARK 4X9 LF (GAUZE/BANDAGES/DRESSINGS) ×3 IMPLANT
CHLORAPREP W/TINT 26 (MISCELLANEOUS) ×6 IMPLANT
CLOSURE WOUND 1/2 X4 (GAUZE/BANDAGES/DRESSINGS) ×2
CLOSURE WOUND 1/4X4 (GAUZE/BANDAGES/DRESSINGS) ×1
CORD BIPOLAR FORCEPS 12FT (ELECTRODE) ×3 IMPLANT
COVER WAND RF STERILE (DRAPES) IMPLANT
CUFF TOURN SGL QUICK 24 (TOURNIQUET CUFF) ×2
CUFF TRNQT CYL 24X4X16.5-23 (TOURNIQUET CUFF) ×1 IMPLANT
DECANTER SPIKE VIAL GLASS SM (MISCELLANEOUS) IMPLANT
DRAPE EXTREMITY T 121X128X90 (DISPOSABLE) IMPLANT
DRAPE IMP U-DRAPE 54X76 (DRAPES) IMPLANT
DRAPE INCISE IOBAN 66X45 STRL (DRAPES) ×3 IMPLANT
DRAPE OEC MINIVIEW 54X84 (DRAPES) ×3 IMPLANT
DRAPE U-SHAPE 47X51 STRL (DRAPES) ×3 IMPLANT
DRAPE U-SHAPE 76X120 STRL (DRAPES) IMPLANT
DRSG AQUACEL AG ADV 3.5X10 (GAUZE/BANDAGES/DRESSINGS) ×3 IMPLANT
DRSG MEPILEX BORDER 4X8 (GAUZE/BANDAGES/DRESSINGS) ×3 IMPLANT
DRSG PAD ABDOMINAL 8X10 ST (GAUZE/BANDAGES/DRESSINGS) ×6 IMPLANT
ELECT REM PT RETURN 9FT ADLT (ELECTROSURGICAL) ×3
ELECTRODE REM PT RTRN 9FT ADLT (ELECTROSURGICAL) ×1 IMPLANT
GAUZE SPONGE 4X4 12PLY STRL (GAUZE/BANDAGES/DRESSINGS) ×3 IMPLANT
GAUZE XEROFORM 1X8 LF (GAUZE/BANDAGES/DRESSINGS) ×3 IMPLANT
GAUZE XEROFORM 5X9 LF (GAUZE/BANDAGES/DRESSINGS) IMPLANT
GLOVE BIOGEL PI IND STRL 8 (GLOVE) ×1 IMPLANT
GLOVE BIOGEL PI INDICATOR 8 (GLOVE) ×2
GLOVE ECLIPSE 8.0 STRL XLNG CF (GLOVE) ×3 IMPLANT
GOWN STRL REUS W/ TWL LRG LVL3 (GOWN DISPOSABLE) ×1 IMPLANT
GOWN STRL REUS W/TWL LRG LVL3 (GOWN DISPOSABLE) ×2
GOWN STRL REUS W/TWL XL LVL3 (GOWN DISPOSABLE) ×3 IMPLANT
NS IRRIG 1000ML POUR BTL (IV SOLUTION) ×3 IMPLANT
PACK ARTHROSCOPY DSU (CUSTOM PROCEDURE TRAY) ×3 IMPLANT
PACK BASIN DAY SURGERY FS (CUSTOM PROCEDURE TRAY) ×3 IMPLANT
PAD CAST 4YDX4 CTTN HI CHSV (CAST SUPPLIES) ×1 IMPLANT
PADDING CAST COTTON 4X4 STRL (CAST SUPPLIES) ×2
PENCIL BUTTON HOLSTER BLD 10FT (ELECTRODE) ×3 IMPLANT
PLATE LOCK COMP 9H 3.5 FOOT (Plate) ×3 IMPLANT
SCREW CORTICAL 2.7 MM 22MM (Screw) ×3 IMPLANT
SCREW CORTICAL 3.5MM  16MM (Screw) ×2 IMPLANT
SCREW CORTICAL 3.5MM 16MM (Screw) ×1 IMPLANT
SCREW CORTICAL 3.5MM 18MM (Screw) ×9 IMPLANT
SCREW CORTICAL 3.5MM 24MM (Screw) ×6 IMPLANT
SCREW CORTICAL 3.5MM 26MM (Screw) ×6 IMPLANT
SCREW LOCK CORT STAR 3.5X18 (Screw) ×3 IMPLANT
SET IRRIG Y TYPE TUR BLADDER L (SET/KITS/TRAYS/PACK) ×3 IMPLANT
SLEEVE SCD COMPRESS KNEE MED (MISCELLANEOUS) IMPLANT
SLING ARM FOAM STRAP LRG (SOFTGOODS) ×3 IMPLANT
SLING ARM IMMOBILIZER LRG (SOFTGOODS) ×3 IMPLANT
SLING ARM IMMOBILIZER MED (SOFTGOODS) IMPLANT
SLING ARM MED ADULT FOAM STRAP (SOFTGOODS) IMPLANT
SLING ARM XL FOAM STRAP (SOFTGOODS) IMPLANT
SPLINT FAST PLASTER 5X30 (CAST SUPPLIES) ×20
SPLINT PLASTER CAST FAST 5X30 (CAST SUPPLIES) ×10 IMPLANT
SPONGE LAP 18X18 RF (DISPOSABLE) ×9 IMPLANT
STAPLER VISISTAT 35W (STAPLE) IMPLANT
STRIP CLOSURE SKIN 1/2X4 (GAUZE/BANDAGES/DRESSINGS) ×4 IMPLANT
STRIP CLOSURE SKIN 1/4X4 (GAUZE/BANDAGES/DRESSINGS) ×2 IMPLANT
SUCTION FRAZIER HANDLE 10FR (MISCELLANEOUS) ×2
SUCTION TUBE FRAZIER 10FR DISP (MISCELLANEOUS) ×1 IMPLANT
SUT ETHILON 3 0 PS 1 (SUTURE) IMPLANT
SUT MNCRL AB 4-0 PS2 18 (SUTURE) ×6 IMPLANT
SUT VIC AB 0 CT1 27 (SUTURE) ×4
SUT VIC AB 0 CT1 27XBRD ANBCTR (SUTURE) ×2 IMPLANT
SUT VIC AB 2-0 SH 27 (SUTURE) ×2
SUT VIC AB 2-0 SH 27XBRD (SUTURE) ×1 IMPLANT
SUT VIC AB 3-0 SH 27 (SUTURE) ×2
SUT VIC AB 3-0 SH 27X BRD (SUTURE) ×1 IMPLANT
SYR BULB 3OZ (MISCELLANEOUS) ×3 IMPLANT
TOWEL GREEN STERILE FF (TOWEL DISPOSABLE) ×3 IMPLANT
YANKAUER SUCT BULB TIP NO VENT (SUCTIONS) ×3 IMPLANT

## 2019-04-09 NOTE — Discharge Instructions (Signed)

## 2019-04-09 NOTE — Anesthesia Procedure Notes (Addendum)
Anesthesia Regional Block: Supraclavicular block   Pre-Anesthetic Checklist: ,, timeout performed, Correct Patient, Correct Site, Correct Laterality, Correct Procedure, Correct Position, site marked, Risks and benefits discussed,  Surgical consent,  Pre-op evaluation,  At surgeon's request and post-op pain management  Laterality: Left  Prep: chloraprep       Needles:  Injection technique: Single-shot  Needle Type: Echogenic Needle     Needle Length: 9cm      Additional Needles:   Procedures:,,,, ultrasound used (permanent image in chart),,,,  Narrative:  Start time: 04/09/2019 7:00 AM End time: 04/09/2019 7:11 AM Injection made incrementally with aspirations every 5 mL.  Performed by: Personally  Anesthesiologist: Eilene Ghazi, MD  Additional Notes: Patient tolerated the procedure well without complications

## 2019-04-09 NOTE — Transfer of Care (Signed)
Immediate Anesthesia Transfer of Care Note  Patient: Jacob Velazquez  Procedure(s) Performed: OPEN REDUCTION INTERNAL FIXATION (ORIF) LEFT HUMERAL FRACTURE (Left ) ULNAR NERVE DECOMPRESSION/TRANSPOSITION (Left ) IRRIGATION AND DEBRIDEMENT WOUND (Left )  Patient Location: PACU  Anesthesia Type:General  Level of Consciousness: sedated and patient cooperative  Airway & Oxygen Therapy: Patient Spontanous Breathing and Patient connected to nasal cannula oxygen  Post-op Assessment: Report given to RN and Post -op Vital signs reviewed and stable  Post vital signs: Reviewed and stable  Last Vitals:  Vitals Value Taken Time  BP    Temp    Pulse    Resp    SpO2      Last Pain:  Vitals:   04/09/19 0633  TempSrc: Oral  PainSc: 0-No pain      Patients Stated Pain Goal: 7 (04/09/19 1962)  Complications: No apparent anesthesia complications
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
## 2019-04-09 NOTE — Anesthesia Postprocedure Evaluation (Signed)
Anesthesia Post Note  Patient: Jacob Velazquez  Procedure(s) Performed: OPEN REDUCTION INTERNAL FIXATION (ORIF) LEFT HUMERAL FRACTURE (Left ) ULNAR NERVE DECOMPRESSION/TRANSPOSITION (Left ) IRRIGATION AND DEBRIDEMENT WOUND (Left )     Patient location during evaluation: PACU Anesthesia Type: General Level of consciousness: awake and alert Pain management: pain level controlled Vital Signs Assessment: post-procedure vital signs reviewed and stable Respiratory status: spontaneous breathing, nonlabored ventilation, respiratory function stable and patient connected to nasal cannula oxygen Cardiovascular status: blood pressure returned to baseline and stable Postop Assessment: no apparent nausea or vomiting Anesthetic complications: no    Last Vitals:  Vitals:   04/09/19 1045 04/09/19 1114  BP: 127/73 130/84  Pulse: 71 73  Resp: 13 18  Temp:  36.7 C  SpO2: 99% 100%    Last Pain:  Vitals:   04/09/19 1114  TempSrc:   PainSc: 0-No pain                 Ezella Kell S

## 2019-04-09 NOTE — Anesthesia Procedure Notes (Signed)
Anesthesia Procedure Image    

## 2019-04-09 NOTE — Progress Notes (Signed)
Assisted Dr. Rose with left, ultrasound guided, supraclavicular block. Side rails up, monitors on throughout procedure. See vital signs in flow sheet. Tolerated Procedure well. 

## 2019-04-09 NOTE — Anesthesia Procedure Notes (Signed)
Procedure Name: Intubation Date/Time: 04/09/2019 7:38 AM Performed by: Wanita Chamberlain, CRNA Pre-anesthesia Checklist: Patient identified, Emergency Drugs available, Suction available, Timeout performed and Patient being monitored Patient Re-evaluated:Patient Re-evaluated prior to induction Oxygen Delivery Method: Circle system utilized Preoxygenation: Pre-oxygenation with 100% oxygen Induction Type: IV induction Ventilation: Mask ventilation without difficulty Laryngoscope Size: Mac and 3 Grade View: Grade I Tube type: Oral Tube size: 8.0 mm Number of attempts: 1 Airway Equipment and Method: Stylet Placement Confirmation: ETT inserted through vocal cords under direct vision,  positive ETCO2,  CO2 detector and breath sounds checked- equal and bilateral Secured at: 22 cm Tube secured with: Tape Dental Injury: Teeth and Oropharynx as per pre-operative assessment
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
## 2019-04-10 ENCOUNTER — Encounter (HOSPITAL_BASED_OUTPATIENT_CLINIC_OR_DEPARTMENT_OTHER): Payer: Self-pay | Admitting: Orthopaedic Surgery

## 2019-06-12 ENCOUNTER — Other Ambulatory Visit (HOSPITAL_COMMUNITY): Payer: Self-pay | Admitting: Emergency Medicine

## 2019-06-12 ENCOUNTER — Other Ambulatory Visit (HOSPITAL_COMMUNITY)
Admission: RE | Admit: 2019-06-12 | Discharge: 2019-06-12 | Disposition: A | Discharge status: Home / Self Care | Payer: BC Managed Care – PPO | Source: Ambulatory Visit | Attending: Orthopaedic Surgery | Admitting: Orthopaedic Surgery

## 2019-06-12 ENCOUNTER — Encounter (HOSPITAL_COMMUNITY): Payer: Self-pay | Admitting: Emergency Medicine

## 2019-06-12 ENCOUNTER — Other Ambulatory Visit: Payer: Self-pay

## 2019-06-12 DIAGNOSIS — Z1159 Encounter for screening for other viral diseases: Secondary | ICD-10-CM | POA: Insufficient documentation

## 2019-06-12 LAB — SARS CORONAVIRUS 2 (TAT 6-24 HRS): SARS Coronavirus 2: NEGATIVE

## 2019-06-12 NOTE — Progress Notes (Signed)
Jacob Velazquez     Your procedure is scheduled on: 06-13-2019   Report to [facility] Main  Entrance    Report to ADMITTING at 12:15PM       Call this number if you have problems the morning of surgery [place], NO CHEWING GUM CANDY OR MINTS.     Remember: Do not eat food After Midnight. YOU MAY HAVE CLEAR LIQUIDS FROM MIDNIGHT UNTIL 9:45AM. At 9:45AM Please finish the prescribed Pre-Surgery ENSURE drink. Nothing by mouth after you finish the ENSURE drink !     CLEAR LIQUID DIET   Foods Allowed                                                                     Foods Excluded  Coffee and tea, regular and decaf                             liquids that you cannot  Plain Jell-O in any flavor                                             see through such as: Fruit ices (not with fruit pulp)                                     milk, soups, orange juice  Iced Popsicles                                    All solid food Carbonated beverages, regular and diet                                    Cranberry, grape and apple juices Sports drinks like Gatorade Lightly seasoned clear broth or consume(fat free) Sugar, honey syrup  Sample Menu Breakfast                                Lunch                                     Supper Cranberry juice                    Beef broth                            Chicken broth Jell-O                                     Grape juice  Apple juice Coffee or tea                        Jell-O                                      Popsicle                                                Coffee or tea                        Coffee or tea  _____________________________________________________________________    Take these medicines the morning of surgery with A SIP OF WATER: OXYCODONE  if needed                                 You may not have any metal  on your body including hair pins and piercings  Do not wear jewelry, make-up, lotions, powders or perfumes, deodorant                    Men may shave face and neck.   Do not bring valuables to the hospital. [place] IS NOT RESPONSIBLE   FOR VALUABLES.  Contacts, dentures or bridgework may not be worn into surgery.       Patients discharged the day of surgery will not be allowed to drive home. IF YOU ARE HAVING SURGERY AND GOING HOME THE SAME DAY, YOU MUST HAVE AN ADULT TO DRIVE YOU HOME AND BE WITH YOU FOR 24 HOURS. YOU MAY GO HOME BY TAXI OR UBER OR ORTHERWISE, BUT AN ADULT MUST ACCOMPANY YOU HOME AND STAY WITH YOU FOR 24 HOURS.   Name and phone number of your driver:  Special Instructions: N/A              Please read over the following fact sheets you were given: _____________________________________________________________________

## 2019-06-13 ENCOUNTER — Encounter (HOSPITAL_COMMUNITY): Payer: Self-pay | Admitting: *Deleted

## 2019-06-13 ENCOUNTER — Ambulatory Visit (HOSPITAL_COMMUNITY)
Admission: RE | Admit: 2019-06-13 | Discharge: 2019-06-13 | Disposition: A | Discharge status: Home / Self Care | Payer: BC Managed Care – PPO | Attending: Orthopaedic Surgery | Admitting: Orthopaedic Surgery

## 2019-06-13 ENCOUNTER — Ambulatory Visit (HOSPITAL_COMMUNITY): Payer: BC Managed Care – PPO | Admitting: Certified Registered Nurse Anesthetist

## 2019-06-13 ENCOUNTER — Ambulatory Visit (HOSPITAL_COMMUNITY): Payer: BC Managed Care – PPO

## 2019-06-13 ENCOUNTER — Encounter (HOSPITAL_COMMUNITY)
Admission: RE | Disposition: A | Discharge status: Home / Self Care | Payer: Self-pay | Source: Home / Self Care | Attending: Orthopaedic Surgery

## 2019-06-13 DIAGNOSIS — X58XXXA Exposure to other specified factors, initial encounter: Secondary | ICD-10-CM | POA: Insufficient documentation

## 2019-06-13 DIAGNOSIS — F17210 Nicotine dependence, cigarettes, uncomplicated: Secondary | ICD-10-CM | POA: Diagnosis not present

## 2019-06-13 DIAGNOSIS — Z419 Encounter for procedure for purposes other than remedying health state, unspecified: Secondary | ICD-10-CM

## 2019-06-13 DIAGNOSIS — S42302A Unspecified fracture of shaft of humerus, left arm, initial encounter for closed fracture: Secondary | ICD-10-CM | POA: Diagnosis not present

## 2019-06-13 HISTORY — PX: ORIF HUMERUS FRACTURE: SHX2126

## 2019-06-13 LAB — CBC
HCT: 47.9 % (ref 39.0–52.0)
Hemoglobin: 15.4 g/dL (ref 13.0–17.0)
MCH: 30 pg (ref 26.0–34.0)
MCHC: 32.2 g/dL (ref 30.0–36.0)
MCV: 93.2 fL (ref 80.0–100.0)
Platelets: 159 10*3/uL (ref 150–400)
RBC: 5.14 MIL/uL (ref 4.22–5.81)
RDW: 11.9 % (ref 11.5–15.5)
WBC: 4.1 10*3/uL (ref 4.0–10.5)
nRBC: 0 % (ref 0.0–0.2)

## 2019-06-13 SURGERY — OPEN REDUCTION INTERNAL FIXATION (ORIF) HUMERAL SHAFT FRACTURE
Anesthesia: General | Laterality: Left

## 2019-06-13 MED ORDER — MELOXICAM 7.5 MG PO TABS: 7.5000 mg | ORAL_TABLET | Freq: Every day | ORAL | 2 refills | Status: AC

## 2019-06-13 MED ORDER — ACETAMINOPHEN 500 MG PO TABS: 1000.0000 mg | ORAL_TABLET | Freq: Three times a day (TID) | ORAL | 0 refills | Status: AC

## 2019-06-13 MED ORDER — MIDAZOLAM HCL 2 MG/2ML IJ SOLN
INTRAMUSCULAR | Status: AC
  Filled 2019-06-13: qty 2

## 2019-06-13 MED ORDER — PROPOFOL 10 MG/ML IV BOLUS
INTRAVENOUS | Status: DC | PRN
  Administered 2019-06-13: 30 mg via INTRAVENOUS
  Administered 2019-06-13: 20 mg via INTRAVENOUS

## 2019-06-13 MED ORDER — MIDAZOLAM HCL 5 MG/5ML IJ SOLN
INTRAMUSCULAR | Status: DC | PRN
  Administered 2019-06-13: 2 mg via INTRAVENOUS

## 2019-06-13 MED ORDER — PROPOFOL 10 MG/ML IV BOLUS
INTRAVENOUS | Status: AC
  Filled 2019-06-13: qty 40

## 2019-06-13 MED ORDER — FENTANYL CITRATE (PF) 100 MCG/2ML IJ SOLN
INTRAMUSCULAR | Status: AC
  Administered 2019-06-13: 100 ug via INTRAVENOUS
  Filled 2019-06-13: qty 2

## 2019-06-13 MED ORDER — ONDANSETRON HCL 4 MG/2ML IJ SOLN
INTRAMUSCULAR | Status: DC | PRN
  Administered 2019-06-13: 4 mg via INTRAVENOUS

## 2019-06-13 MED ORDER — OXYCODONE HCL 5 MG PO TABS: ORAL_TABLET | ORAL | 0 refills | Status: AC

## 2019-06-13 MED ORDER — BUPIVACAINE HCL (PF) 0.25 % IJ SOLN
INTRAMUSCULAR | Status: AC
  Filled 2019-06-13: qty 30

## 2019-06-13 MED ORDER — PHENYLEPHRINE 40 MCG/ML (10ML) SYRINGE FOR IV PUSH (FOR BLOOD PRESSURE SUPPORT)
PREFILLED_SYRINGE | INTRAVENOUS | Status: DC | PRN
  Administered 2019-06-13 (×4): 80 ug via INTRAVENOUS
  Administered 2019-06-13: 120 ug via INTRAVENOUS
  Administered 2019-06-13 (×7): 80 ug via INTRAVENOUS

## 2019-06-13 MED ORDER — PHENYLEPHRINE 40 MCG/ML (10ML) SYRINGE FOR IV PUSH (FOR BLOOD PRESSURE SUPPORT)
PREFILLED_SYRINGE | INTRAVENOUS | Status: AC
  Filled 2019-06-13: qty 20

## 2019-06-13 MED ORDER — PROPOFOL 10 MG/ML IV BOLUS
INTRAVENOUS | Status: AC
  Filled 2019-06-13: qty 60

## 2019-06-13 MED ORDER — 0.9 % SODIUM CHLORIDE (POUR BTL) OPTIME
TOPICAL | Status: DC | PRN
  Administered 2019-06-13: 16:00:00 1000 mL

## 2019-06-13 MED ORDER — CEFAZOLIN SODIUM-DEXTROSE 2-4 GM/100ML-% IV SOLN
2.0000 g | INTRAVENOUS | Status: AC
  Administered 2019-06-13: 2 g via INTRAVENOUS
  Filled 2019-06-13: qty 100

## 2019-06-13 MED ORDER — FENTANYL CITRATE (PF) 100 MCG/2ML IJ SOLN
100.0000 ug | Freq: Once | INTRAMUSCULAR | Status: AC
  Administered 2019-06-13: 100 ug via INTRAVENOUS

## 2019-06-13 MED ORDER — FENTANYL CITRATE (PF) 100 MCG/2ML IJ SOLN
INTRAMUSCULAR | Status: AC
  Filled 2019-06-13: qty 2

## 2019-06-13 MED ORDER — DEXAMETHASONE SODIUM PHOSPHATE 10 MG/ML IJ SOLN
INTRAMUSCULAR | Status: DC | PRN
  Administered 2019-06-13: 10 mg via INTRAVENOUS

## 2019-06-13 MED ORDER — VANCOMYCIN HCL 1000 MG IV SOLR
INTRAVENOUS | Status: AC
  Filled 2019-06-13: qty 1000

## 2019-06-13 MED ORDER — MIDAZOLAM HCL 2 MG/2ML IJ SOLN
2.0000 mg | Freq: Once | INTRAMUSCULAR | Status: AC
  Administered 2019-06-13: 2 mg via INTRAVENOUS

## 2019-06-13 MED ORDER — CHLORHEXIDINE GLUCONATE 4 % EX LIQD: 60.0000 mL | Freq: Once | CUTANEOUS | Status: DC

## 2019-06-13 MED ORDER — VANCOMYCIN HCL 1 G IV SOLR
INTRAVENOUS | Status: DC | PRN
  Administered 2019-06-13: 1000 mg

## 2019-06-13 MED ORDER — LACTATED RINGERS IV SOLN
INTRAVENOUS | Status: DC
  Administered 2019-06-13 (×2): via INTRAVENOUS

## 2019-06-13 MED ORDER — FENTANYL CITRATE (PF) 100 MCG/2ML IJ SOLN
INTRAMUSCULAR | Status: DC | PRN
  Administered 2019-06-13: 25 ug via INTRAVENOUS
  Administered 2019-06-13 (×2): 50 ug via INTRAVENOUS

## 2019-06-13 MED ORDER — MIDAZOLAM HCL 2 MG/2ML IJ SOLN
INTRAMUSCULAR | Status: AC
  Administered 2019-06-13: 2 mg via INTRAVENOUS
  Filled 2019-06-13: qty 2

## 2019-06-13 MED ORDER — PROPOFOL 500 MG/50ML IV EMUL
INTRAVENOUS | Status: DC | PRN
  Administered 2019-06-13: 125 ug/kg/min via INTRAVENOUS

## 2019-06-13 MED ORDER — PHENYLEPHRINE 40 MCG/ML (10ML) SYRINGE FOR IV PUSH (FOR BLOOD PRESSURE SUPPORT)
PREFILLED_SYRINGE | INTRAVENOUS | Status: AC
  Filled 2019-06-13: qty 10

## 2019-06-13 MED ORDER — ONDANSETRON HCL 4 MG PO TABS: 4.0000 mg | ORAL_TABLET | Freq: Three times a day (TID) | ORAL | 1 refills | Status: AC | PRN

## 2019-06-13 SURGICAL SUPPLY — 58 items
BENZOIN TINCTURE PRP APPL 2/3 (GAUZE/BANDAGES/DRESSINGS) ×3 IMPLANT
BIT DRILL 110X2.5XQCK CNCT (BIT) ×1 IMPLANT
BIT DRILL 2.5 (BIT) ×2
BIT DRILL QC 110 3.5 (BIT) ×1
BIT DRILL QC 110 3.5MM (BIT) ×1 IMPLANT
BIT DRILL QC 3.3X195 (BIT) ×3 IMPLANT
BIT DRL 110X2.5XQCK CNCT (BIT) ×1
CAP LOCK NCB (Cap) ×15 IMPLANT
CLOSURE WOUND 1/2 X4 (GAUZE/BANDAGES/DRESSINGS) ×1
COVER WAND RF STERILE (DRAPES) IMPLANT
DECANTER SPIKE VIAL GLASS SM (MISCELLANEOUS) IMPLANT
DRAPE C-ARM 42X120 X-RAY (DRAPES) ×3 IMPLANT
DRAPE IMP U-DRAPE 54X76 (DRAPES) ×6 IMPLANT
DRAPE INCISE IOBAN 66X45 STRL (DRAPES) ×3 IMPLANT
DRAPE ORTHO SPLIT 77X108 STRL (DRAPES) ×4
DRAPE SHEET LG 3/4 BI-LAMINATE (DRAPES) ×3 IMPLANT
DRAPE SURG ORHT 6 SPLT 77X108 (DRAPES) ×2 IMPLANT
DRILL BIT QC 110 3.5MM (BIT) ×2
DRSG AQUACEL AG ADV 3.5X10 (GAUZE/BANDAGES/DRESSINGS) ×3 IMPLANT
DRSG MEPILEX BORDER 4X8 (GAUZE/BANDAGES/DRESSINGS) ×3 IMPLANT
DURAPREP 26ML APPLICATOR (WOUND CARE) ×3 IMPLANT
ELECT REM PT RETURN 15FT ADLT (MISCELLANEOUS) ×3 IMPLANT
GLOVE BIO SURGEON STRL SZ8 (GLOVE) ×3 IMPLANT
GLOVE BIOGEL PI IND STRL 8 (GLOVE) ×2 IMPLANT
GLOVE BIOGEL PI INDICATOR 8 (GLOVE) ×4
GLOVE ECLIPSE 8.0 STRL XLNG CF (GLOVE) ×3 IMPLANT
GOWN STRL REUS W/TWL 2XL LVL3 (GOWN DISPOSABLE) ×3 IMPLANT
GOWN STRL REUS W/TWL XL LVL3 (GOWN DISPOSABLE) ×3 IMPLANT
KIT BASIN OR (CUSTOM PROCEDURE TRAY) ×3 IMPLANT
KIT STABILIZATION SHOULDER (MISCELLANEOUS) ×3 IMPLANT
NS IRRIG 1000ML POUR BTL (IV SOLUTION) ×3 IMPLANT
PACK SHOULDER (CUSTOM PROCEDURE TRAY) ×3 IMPLANT
PLATE FRACTURE NCB PA 174 H12 (Plate) ×3 IMPLANT
RESTRAINT HEAD UNIVERSAL NS (MISCELLANEOUS) IMPLANT
SCREW CORT SELFTAP 3.5X26 (Miscellaneous) ×6 IMPLANT
SCREW NCB 4.0 28MM (Screw) ×3 IMPLANT
SCREW NCB 4.0 32MM (Screw) ×6 IMPLANT
SCREW NCB 4.0X26MM (Screw) ×12 IMPLANT
SCREW NCB 4.0X36MM (Screw) ×3 IMPLANT
SLEEVE SCD COMPRESS KNEE MED (MISCELLANEOUS) IMPLANT
SLING ARM FOAM STRAP LRG (SOFTGOODS) ×3 IMPLANT
SLING ARM FOAM STRAP MED (SOFTGOODS) IMPLANT
SLING ARM FOAM STRAP XLG (SOFTGOODS) IMPLANT
SLING ARM IMMOBILIZER LRG (SOFTGOODS) IMPLANT
SLING ARM IMMOBILIZER MED (SOFTGOODS) IMPLANT
STAPLER VISISTAT 35W (STAPLE) IMPLANT
STRIP CLOSURE SKIN 1/2X4 (GAUZE/BANDAGES/DRESSINGS) ×2 IMPLANT
SUT ETHIBOND 2 OS 4 DA (SUTURE) ×3 IMPLANT
SUT ETHILON 3 0 PS 1 (SUTURE) IMPLANT
SUT MNCRL AB 4-0 PS2 18 (SUTURE) ×3 IMPLANT
SUT VIC AB 0 CT1 27 (SUTURE) ×2
SUT VIC AB 0 CT1 27XBRD ANBCTR (SUTURE) ×1 IMPLANT
SUT VIC AB 2-0 SH 27 (SUTURE) ×4
SUT VIC AB 2-0 SH 27XBRD (SUTURE) ×2 IMPLANT
SUT VIC AB 3-0 SH 27 (SUTURE)
SUT VIC AB 3-0 SH 27X BRD (SUTURE) IMPLANT
SYR BULB IRRIGATION 50ML (SYRINGE) ×3 IMPLANT
TUBE SUCTION HIGH CAP CLEAR NV (SUCTIONS) ×3 IMPLANT
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
## 2019-06-13 NOTE — Progress Notes (Signed)
Assisted Dr. Ossey with left, ultrasound guided, supraclavicular block. Side rails up, monitors on throughout procedure. See vital signs in flow sheet. Tolerated Procedure well. 

## 2019-06-13 NOTE — Anesthesia Preprocedure Evaluation (Addendum)
Anesthesia Evaluation  Patient identified by MRN, date of birth, ID band Patient awake    Reviewed: Allergy & Precautions, NPO status , Patient's Chart, lab work & pertinent test results  Airway Mallampati: I  TM Distance: >3 FB Neck ROM: Full    Dental   Pulmonary Current Smoker,    Pulmonary exam normal        Cardiovascular Normal cardiovascular exam     Neuro/Psych    GI/Hepatic   Endo/Other    Renal/GU      Musculoskeletal   Abdominal   Peds  Hematology   Anesthesia Other Findings   Reproductive/Obstetrics                             Anesthesia Physical Anesthesia Plan  ASA: II  Anesthesia Plan: Regional   Post-op Pain Management:    Induction: Intravenous  PONV Risk Score and Plan: 1 and Ondansetron  Airway Management Planned: Simple Face Mask  Additional Equipment:   Intra-op Plan:   Post-operative Plan:   Informed Consent: I have reviewed the patients History and Physical, chart, labs and discussed the procedure including the risks, benefits and alternatives for the proposed anesthesia with the patient or authorized representative who has indicated his/her understanding and acceptance.       Plan Discussed with: CRNA and Surgeon  Anesthesia Plan Comments:        Anesthesia Quick Evaluation

## 2019-06-13 NOTE — Transfer of Care (Signed)
Immediate Anesthesia Transfer of Care Note  Patient: Jacob Velazquez  Procedure(s) Performed: Procedure(s): OPEN REDUCTION INTERNAL FIXATION (ORIF)LEFT HUMERAL SHAFT FRACTURE (Left)  Patient Location: PACU  Anesthesia Type:MAC  Level of Consciousness:  sedated, patient cooperative and responds to stimulation  Airway & Oxygen Therapy:Patient Spontanous Breathing and Patient connected to face mask oxgen  Post-op Assessment:  Report given to PACU RN and Post -op Vital signs reviewed and stable  Post vital signs:  Reviewed and stable  Last Vitals:  Vitals:   06/13/19 1521 06/13/19 1526  BP: 130/61 128/68  Pulse: 74 79  Resp: 15 15  Temp:    SpO2: 615% 379%    Complications: No apparent anesthesia complications

## 2019-06-13 NOTE — Anesthesia Procedure Notes (Signed)
Procedure Name: MAC Date/Time: 06/13/2019 4:08 PM Performed by: West Pugh, CRNA Pre-anesthesia Checklist: Patient identified, Emergency Drugs available, Suction available, Patient being monitored and Timeout performed Patient Re-evaluated:Patient Re-evaluated prior to induction Oxygen Delivery Method: Simple face mask Preoxygenation: Pre-oxygenation with 100% oxygen Induction Type: IV induction Placement Confirmation: positive ETCO2 Dental Injury: Teeth and Oropharynx as per pre-operative assessment

## 2019-06-13 NOTE — Op Note (Signed)
Orthopaedic Surgery Operative Note (CSN: [id number])  Jacob Velazquez  Nov 20, 1991 Date of Surgery: 06/13/2019   Diagnoses:  LEFT DISTAL HUMERUS SHAFT FRACTURE  Procedure: Left periprosthetic humeral shaft ORIF   Operative Finding Successful completion of planned procedure.  Patient had healing of his previously fixed fracture from 2 months ago however he had fractured along the plate superiorly.  There was an extension of the fracture up to the deltoid insertion however this was completely nondisplaced.  We were able to bridge this with our plate.  We had great fixation with a plate however distally the screws had to be placed around our previously positioned hardware and some of the screws were unicortical.  We used a series of locking and nonlocking screws distally and nonlocking screws proximally to get our fixation.  We were able to dissect out the median nerve and protected during the distal aspect of her case and we used the brachialis split along the shaft.  Plate was placed medially on the shaft.  Patient is at risk for infection as he is now had 2 surgeries but he seemed agree going on to union of his previous surgery without issue.  The distal callus really held our plate off of bone even with debridement.  We locked the distal screws because of this.  Post-operative plan: The patient will be nonweightbearing in a sling for comfort.  The patient will be discharged home.  DVT prophylaxis not indicated in ambulatory upper extremity patient without risk factors.  Pain control with PRN pain medication preferring oral medicines.  Follow up plan will be scheduled in approximately 7 days for incision check and XR.  Post-Op Diagnosis: Same Surgeons:Primary: Hiram Gash, MD Assistants: Joya Gaskins, OPAC Location: [place] [facility] Anesthesia: MAC with regional Antibiotics: Ancef 2g preop, Vancomycin 1016m locally  Tourniquet time: None Estimated Blood Loss: 1270Complications: None Specimens:  None Implants: Implant Name Type Inv. Item Serial No. Manufacturer Lot No. LRB No. Used Action  PLATE FRACTURE NCB PA 174 H12 - [id number] PLATE FRACTURE NCB PA 174 H12  [employer](ORTH,TRAU,BIO,SG)  Left 1 Implanted  3.5 x 26 mm cortical screw      Left 2 Implanted  SCREW NCB 4.0X36MM - [id number] SCREW NCB 4.0X36MM  [employer](ORTH,TRAU,BIO,SG)  Left 1 Explanted  SCREW NCB 4.0X26MM - [id number] SCREW NCB 4.0X26MM  [employer](ORTH,TRAU,BIO,SG)  Left 4 Implanted  SCREW NCB 4.0 32MM - [id number] SCREW NCB 4.0 32MM  [employer](ORTH,TRAU,BIO,SG)  Left 2 Implanted  SCREW NCB 4.0 28MM - [id number] SCREW NCB 4.0 28MM  [employer](ORTH,TRAU,BIO,SG)  Left 1 Implanted  CAP LOCK NCB - [id number] CAP LOCK NCB  [employer](ORTH,TRAU,BIO,SG)  Left 5 Implanted    Indications for Surgery:   Jacob LASECKIis a 28y.o. male with fall after previous ORIF of a distal humeral shaft fracture that was going on towards union.  He had significant displacement of the fracture and diastases and was very uncomfortable.  We are worried that nonoperative management with a Sarmiento brace would not work well as the hardware was in place.  Benefits and risks of operative and nonoperative management were discussed prior to surgery with patient/guardian(s) and informed consent form was completed.  Specific risks including infection, need for additional surgery, perioperative fracture, infection, perioperative damage to nerves and vessels.  Range of motion limitations and stiffness.   Procedure:   The patient was identified in the preoperative holding area where the surgical site was  marked. The patient was taken to the OR where a procedural timeout was called and the above noted anesthesia was induced.  The patient was positioned supine on a hand table.  Preoperative antibiotics were dosed.  The patient's left arm was prepped and draped in the usual sterile fashion.  A second preoperative  timeout was called.      We began with a longitudinal approach to the anterior humeral shaft.  We dissected through skin sharply achieving hemostasis we progressed.  We identified the-over the biceps were able to open this.  Distally were able to mobilize the cephalic nerve and identified the median nerve more medial.  We mobilized both the structures.  We then dissected deep and found the brachialis and there was a midline split in the brachialis from the trauma.  We opened this bluntly with a Cobb.  This point were able to identify the shaft.  There was a proximal extension of the obvious fracture line that was nondisplaced to the tuberosity of the deltoid.  This was completely nondisplaced but we felt that this need to be bridged.  We exposed the shaft and cleared hematoma from the site of the new fracture.  There is obvious callus from the previous fracture and there were screws that were noted within the fracture itself but we were unable to remove these as they came from a posterior approach.  We felt that would be more morbid for the patient to make an secondary incision.  He has symptoms we can take them out at another time.  We then were able to use clamps to achieve an anatomic reduction and placed 2 3.5 mm lag screws by technique.  These were titanium screws.  We used fluoroscopy to make sure we would not interfere with her other plate or screws.  At that point we contoured a 12 hole [employer] NCB plate to the medial aspect of the of the humeral shaft.  We are able to get 4 screws distally taking care to use fluoroscopy to guide our placement to avoid interfering with her old hardware screws.  2 of the screws impacted on the far cortex the posterior lateral plate but we were able to get reasonable bite with them and then were able to walk them.  We locked 2 of the 4 distal holes.  Locking caps were used to do this.  We went proximally and were able to get bicortical purchase with all 3  screws and good bite.  The long working length made [place] feel that we needed to lock the most distal of the proximal screws.  This was done with a locking cap as is typical.  Final fluoroscopic images demonstrated anatomic alignment good fixation.  Incision was irrigated copiously and the a callus was loosely approximated.  Biceps was relocated and neurovascular structures were again confirmed to be intact.  We avoided placement of lateral retractors to avoid damage to the radial nerve.  Local vancomycin powder was placed   Incision was closed in a multilayer fashion and a sterile dressing was placed.  Patient was woken and taken to PACU in stable condition.  Joya Gaskins, OPA-C, present and scrubbed throughout the case, critical for completion in a timely fashion, and for retraction, instrumentation, closure.

## 2019-06-15 ENCOUNTER — Encounter (HOSPITAL_COMMUNITY): Payer: Self-pay | Admitting: Orthopaedic Surgery

## 2019-06-18 NOTE — Anesthesia Postprocedure Evaluation (Signed)
Anesthesia Post Note  Patient: Jacob Velazquez  Procedure(s) Performed: OPEN REDUCTION INTERNAL FIXATION (ORIF)LEFT HUMERAL SHAFT FRACTURE (Left )     Patient location during evaluation: PACU Anesthesia Type: MAC and Regional Level of consciousness: awake and alert Pain management: pain level controlled Vital Signs Assessment: post-procedure vital signs reviewed and stable Respiratory status: spontaneous breathing, nonlabored ventilation, respiratory function stable and patient connected to nasal cannula oxygen Cardiovascular status: stable and blood pressure returned to baseline Postop Assessment: no apparent nausea or vomiting Anesthetic complications: no    Last Vitals:  Vitals:   06/13/19 1945 06/13/19 2040  BP: 138/85 138/85  Pulse:  64  Resp: 13   Temp:  (!) 36.4 C  SpO2: 99% 99%    Last Pain:  Vitals:   06/13/19 2040  TempSrc:   PainSc: 0-No pain                 Tionna Gigante S

## 2019-08-30 ENCOUNTER — Other Ambulatory Visit: Payer: Self-pay

## 2019-08-30 DIAGNOSIS — Z20822 Contact with and (suspected) exposure to covid-19: Secondary | ICD-10-CM

## 2019-08-31 LAB — NOVEL CORONAVIRUS, NAA: SARS-CoV-2, NAA: NOT DETECTED

## 2019-10-12 IMAGING — RF LEFT HUMERUS - 2+ VIEW
1 series · 5 of 5 positions shown · non-contrast
Comparison: 04/05/2019, CT 04/05/2019

CLINICAL DATA: ORIF

EXAM:
LEFT HUMERUS - 2+ VIEW

[Series 1: unknown protocol · 0.14mm/px · 5 of 5 slices shown]
[im 1/5]
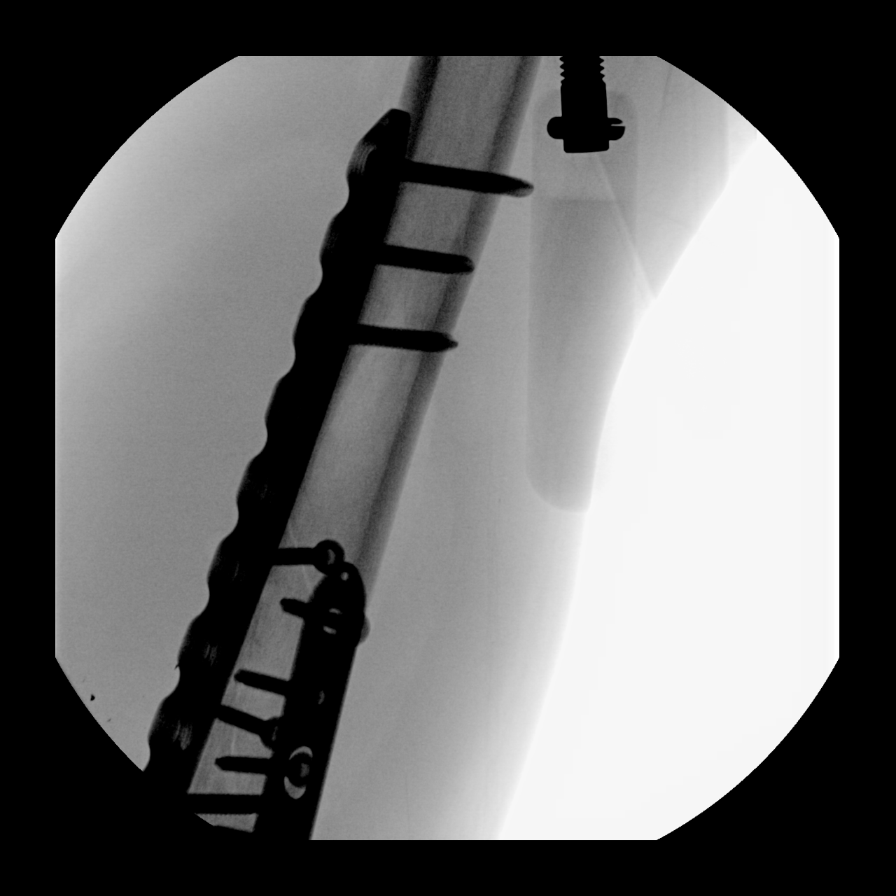
[im 2/5]
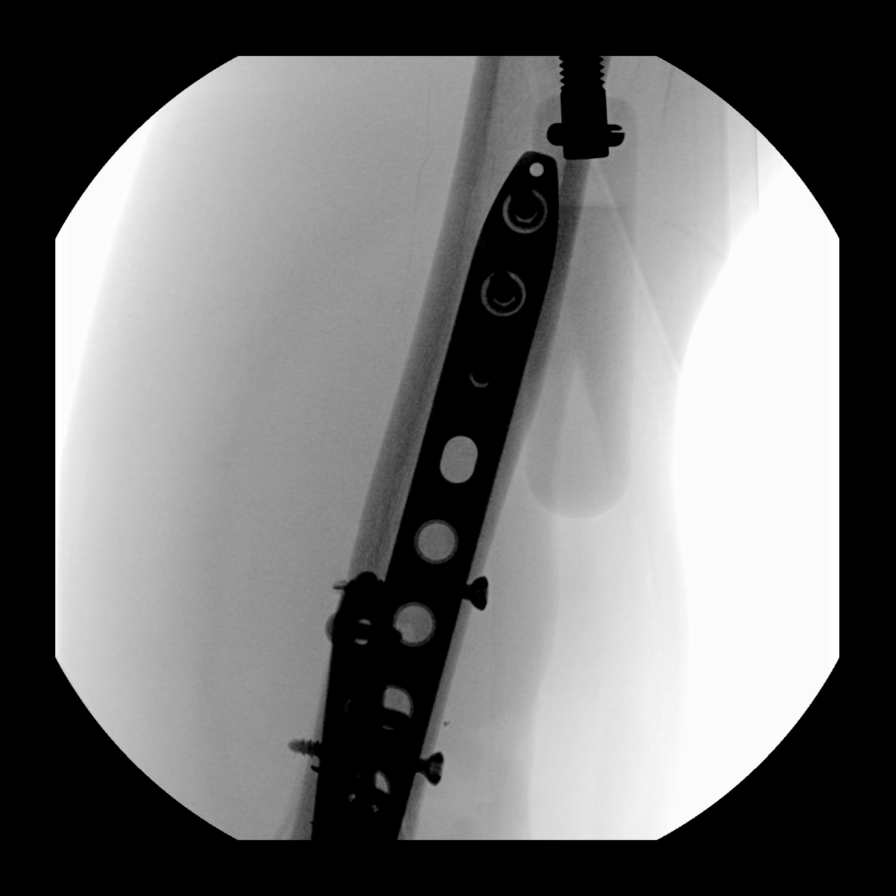
[im 3/5]
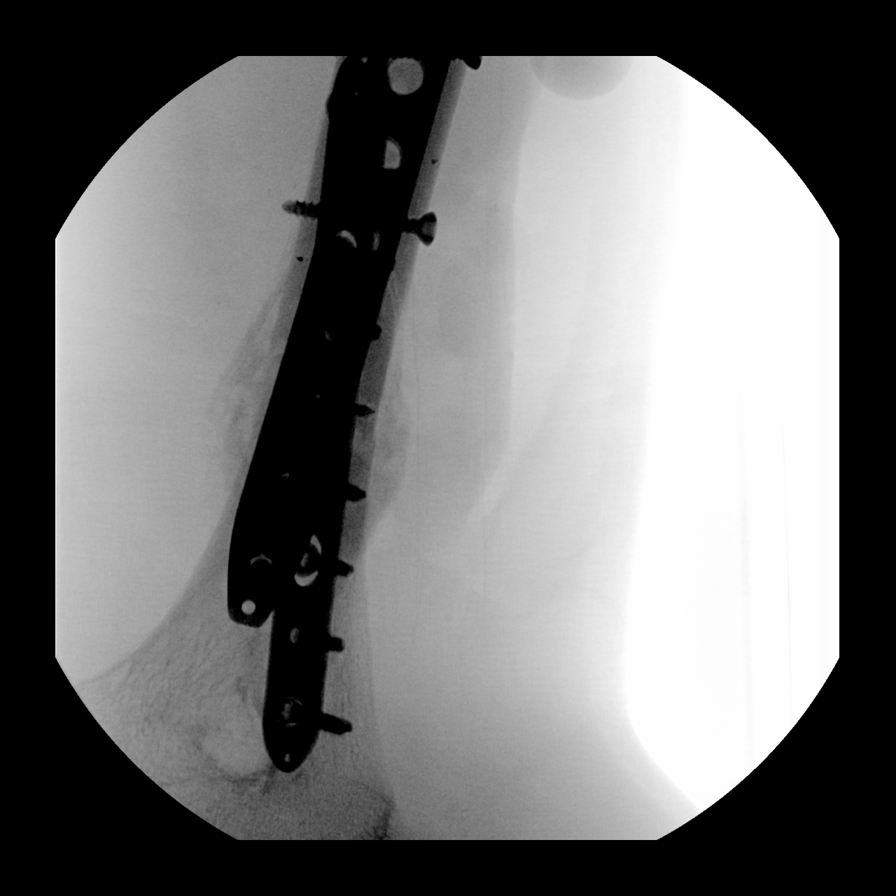
[im 4/5]
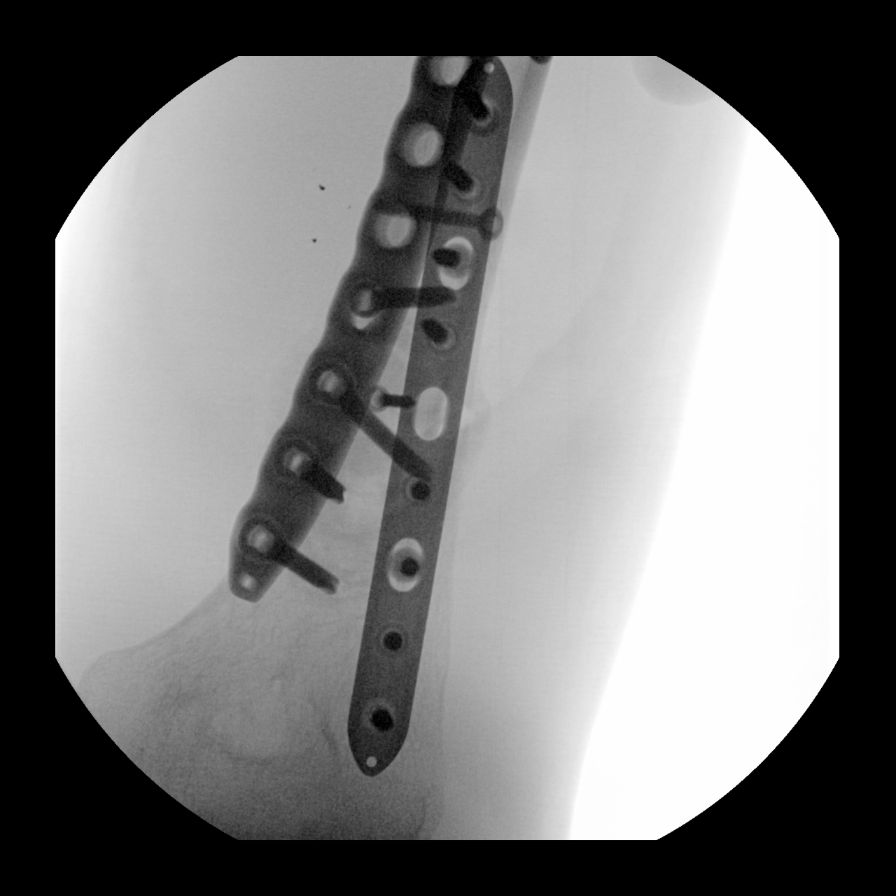
[im 5/5]
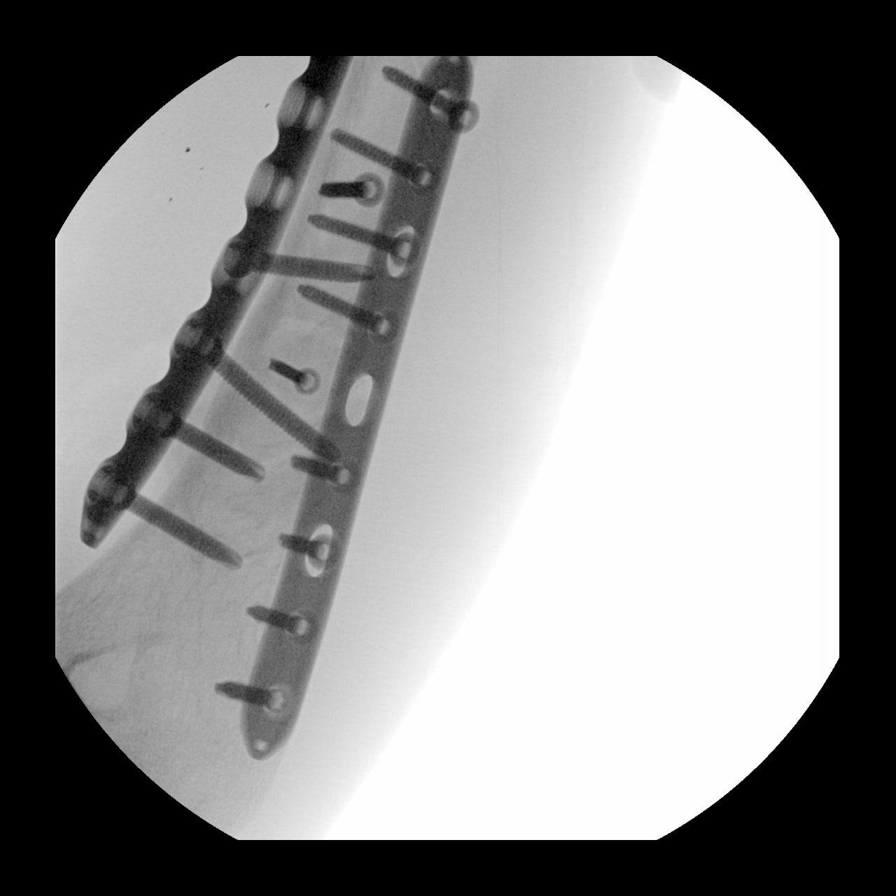

[5 of 5 positions shown; findings below may reference images not displayed]

FINDINGS: Five low resolution intraoperative spot views of the left humerus.
Total fluoroscopy time was 40 seconds. Surgical plate and multiple
screw fixation of the distal humerus across prior comminuted
fracture. Faintly visible callus about the distal humerus. Anatomic
alignment.
IMPRESSION: Intraoperative fluoroscopic assistance provided during surgical
fixation of prior distal humerus fracture

## 2019-11-09 ENCOUNTER — Other Ambulatory Visit: Payer: Self-pay

## 2019-11-09 DIAGNOSIS — Z20822 Contact with and (suspected) exposure to covid-19: Secondary | ICD-10-CM

## 2019-11-12 LAB — NOVEL CORONAVIRUS, NAA: SARS-CoV-2, NAA: NOT DETECTED

## 2019-11-13 ENCOUNTER — Telehealth: Payer: Self-pay | Admitting: General Practice

## 2019-11-13 NOTE — Telephone Encounter (Signed)
Patient called in and received his covid test result °

## 2020-07-27 ENCOUNTER — Other Ambulatory Visit: Payer: Self-pay

## 2020-07-27 ENCOUNTER — Encounter (HOSPITAL_BASED_OUTPATIENT_CLINIC_OR_DEPARTMENT_OTHER): Payer: Self-pay | Admitting: *Deleted

## 2020-07-27 ENCOUNTER — Emergency Department (HOSPITAL_BASED_OUTPATIENT_CLINIC_OR_DEPARTMENT_OTHER)
Admission: EM | Admit: 2020-07-27 | Discharge: 2020-07-27 | Disposition: A | Discharge status: Home / Self Care | Payer: BC Managed Care – PPO | Attending: Emergency Medicine | Admitting: Emergency Medicine

## 2020-07-27 DIAGNOSIS — L089 Local infection of the skin and subcutaneous tissue, unspecified: Secondary | ICD-10-CM

## 2020-07-27 DIAGNOSIS — L401 Generalized pustular psoriasis: Secondary | ICD-10-CM | POA: Insufficient documentation

## 2020-07-27 DIAGNOSIS — F17210 Nicotine dependence, cigarettes, uncomplicated: Secondary | ICD-10-CM | POA: Insufficient documentation

## 2020-07-27 MED ORDER — SULFAMETHOXAZOLE-TRIMETHOPRIM 800-160 MG PO TABS: 1.0000 | ORAL_TABLET | Freq: Two times a day (BID) | ORAL | 0 refills | Status: AC

## 2020-07-27 NOTE — Discharge Instructions (Signed)
Please read and follow all provided instructions.  Your diagnoses today include:  1. Pustule     Tests performed today include:  Vital signs. See below for your results today.   Medications prescribed:   Bactrim (trimethoprim/sulfamethoxazole) - antibiotic  You have been prescribed an antibiotic medicine: take the entire course of medicine even if you are feeling better. Stopping early can cause the antibiotic not to work.  Take any prescribed medications only as directed.   Home care instructions:   Follow any educational materials contained in this packet  Follow-up instructions: Return to the Emergency Department in 48 hours for a recheck if your symptoms are not significantly improved.  Return instructions:  Return to the Emergency Department if you have:  Fever  Worsening symptoms  Worsening pain  Worsening swelling  Redness of the skin that moves away from the affected area, especially if it streaks away from the affected area   Any other emergent concerns  Your vital signs today were: BP 119/88 (BP Location: Left Arm)   Pulse (!) 56   Temp 98.3 F (36.8 C) (Oral)   Resp 16   Ht 5\' 9"  (1.753 m)   Wt 68 kg   SpO2 100%   BMI 22.15 kg/m  If your blood pressure (BP) was elevated above 135/85 this visit, please have this repeated by your doctor within one month. --------------

## 2020-07-27 NOTE — ED Notes (Signed)
Pharmacy and medications updated with patient 

## 2020-07-27 NOTE — ED Triage Notes (Signed)
Pt presents with 2 skin abscess on torso
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
## 2020-12-09 DIAGNOSIS — H01004 Unspecified blepharitis left upper eyelid: Secondary | ICD-10-CM | POA: Diagnosis not present

## 2021-04-12 DIAGNOSIS — R519 Headache, unspecified: Secondary | ICD-10-CM | POA: Diagnosis not present

## 2021-04-12 DIAGNOSIS — R059 Cough, unspecified: Secondary | ICD-10-CM | POA: Diagnosis not present

## 2021-04-12 DIAGNOSIS — Z20822 Contact with and (suspected) exposure to covid-19: Secondary | ICD-10-CM | POA: Diagnosis not present

## 2021-04-12 DIAGNOSIS — U071 COVID-19: Secondary | ICD-10-CM | POA: Diagnosis not present

## 2021-07-06 ENCOUNTER — Encounter (HOSPITAL_BASED_OUTPATIENT_CLINIC_OR_DEPARTMENT_OTHER): Payer: Self-pay

## 2021-07-06 ENCOUNTER — Emergency Department (HOSPITAL_BASED_OUTPATIENT_CLINIC_OR_DEPARTMENT_OTHER)
Admission: EM | Admit: 2021-07-06 | Discharge: 2021-07-06 | Disposition: A | Discharge status: Home / Self Care | Payer: BC Managed Care – PPO | Attending: Emergency Medicine | Admitting: Emergency Medicine

## 2021-07-06 ENCOUNTER — Other Ambulatory Visit: Payer: Self-pay

## 2021-07-06 DIAGNOSIS — F17210 Nicotine dependence, cigarettes, uncomplicated: Secondary | ICD-10-CM | POA: Diagnosis not present

## 2021-07-06 DIAGNOSIS — Z711 Person with feared health complaint in whom no diagnosis is made: Secondary | ICD-10-CM

## 2021-07-06 DIAGNOSIS — R21 Rash and other nonspecific skin eruption: Secondary | ICD-10-CM | POA: Insufficient documentation

## 2021-07-06 DIAGNOSIS — L299 Pruritus, unspecified: Secondary | ICD-10-CM | POA: Diagnosis not present

## 2021-07-06 DIAGNOSIS — Z202 Contact with and (suspected) exposure to infections with a predominantly sexual mode of transmission: Secondary | ICD-10-CM | POA: Diagnosis not present

## 2021-07-06 LAB — URINALYSIS, ROUTINE W REFLEX MICROSCOPIC
Bilirubin Urine: NEGATIVE
Glucose, UA: NEGATIVE mg/dL
Hgb urine dipstick: NEGATIVE
Ketones, ur: NEGATIVE mg/dL
Leukocytes,Ua: NEGATIVE
Nitrite: NEGATIVE
Protein, ur: NEGATIVE mg/dL
Specific Gravity, Urine: >1.03 — ABNORMAL HIGH (ref 1.005–1.030)
pH: 6 (ref 5.0–8.0)

## 2021-07-06 LAB — HIV ANTIBODY (ROUTINE TESTING W REFLEX): HIV Screen 4th Generation wRfx: NONREACTIVE

## 2021-07-06 MED ORDER — PREDNISONE 10 MG PO TABS: 20.0000 mg | ORAL_TABLET | Freq: Every day | ORAL | 0 refills | Status: AC
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
## 2021-07-06 NOTE — Discharge Instructions (Addendum)
Please take 40 mg of the steroids daily for the next 5 days.  You can also take Benadryl as needed for the itching.  Benadryl can make you tired, she can also take Claritin as needed during the day.  Would like [facility]  Give them a call and set up an appointment for follow-up in a few weeks  If ears STI test come back positive, you will receive a call from the clinic.  If it is negative you may not hear from [place].  You can also check your MyChart online.

## 2021-07-06 NOTE — ED Triage Notes (Signed)
Pt c/o bilateral leg rash, itches & painful.   States gets tested regularly for STD. Wants to be tested for HIV, STD, Herpes.

## 2021-07-07 LAB — GC/CHLAMYDIA PROBE AMP (~~LOC~~) NOT AT ARMC
Chlamydia: NEGATIVE
Comment: NEGATIVE
Comment: NORMAL
Neisseria Gonorrhea: NEGATIVE

## 2021-07-27 DIAGNOSIS — Z131 Encounter for screening for diabetes mellitus: Secondary | ICD-10-CM | POA: Diagnosis not present

## 2021-07-27 DIAGNOSIS — Z0001 Encounter for general adult medical examination with abnormal findings: Secondary | ICD-10-CM | POA: Diagnosis not present

## 2021-07-27 DIAGNOSIS — R21 Rash and other nonspecific skin eruption: Secondary | ICD-10-CM | POA: Diagnosis not present

## 2021-07-27 DIAGNOSIS — Z1389 Encounter for screening for other disorder: Secondary | ICD-10-CM | POA: Diagnosis not present

## 2021-07-27 DIAGNOSIS — Z720 Tobacco use: Secondary | ICD-10-CM | POA: Diagnosis not present

## 2021-07-27 DIAGNOSIS — Z136 Encounter for screening for cardiovascular disorders: Secondary | ICD-10-CM | POA: Diagnosis not present

## 2021-07-27 DIAGNOSIS — L301 Dyshidrosis [pompholyx]: Secondary | ICD-10-CM | POA: Diagnosis not present

## 2021-07-28 DIAGNOSIS — L301 Dyshidrosis [pompholyx]: Secondary | ICD-10-CM | POA: Diagnosis not present

## 2021-07-28 DIAGNOSIS — Z0001 Encounter for general adult medical examination with abnormal findings: Secondary | ICD-10-CM | POA: Diagnosis not present

## 2021-07-28 DIAGNOSIS — Z136 Encounter for screening for cardiovascular disorders: Secondary | ICD-10-CM | POA: Diagnosis not present

## 2021-07-28 DIAGNOSIS — Z1389 Encounter for screening for other disorder: Secondary | ICD-10-CM | POA: Diagnosis not present

## 2021-07-28 DIAGNOSIS — Z131 Encounter for screening for diabetes mellitus: Secondary | ICD-10-CM | POA: Diagnosis not present

## 2021-07-28 DIAGNOSIS — R21 Rash and other nonspecific skin eruption: Secondary | ICD-10-CM | POA: Diagnosis not present

## 2021-08-17 DIAGNOSIS — R369 Urethral discharge, unspecified: Secondary | ICD-10-CM | POA: Diagnosis not present

## 2021-11-27 DIAGNOSIS — H5213 Myopia, bilateral: Secondary | ICD-10-CM | POA: Diagnosis not present

## 2023-09-05 ENCOUNTER — Emergency Department (HOSPITAL_BASED_OUTPATIENT_CLINIC_OR_DEPARTMENT_OTHER)
Admission: EM | Admit: 2023-09-05 | Discharge: 2023-09-05 | Disposition: A | Discharge status: Home / Self Care | Payer: BC Managed Care – PPO | Attending: Emergency Medicine | Admitting: Emergency Medicine

## 2023-09-05 ENCOUNTER — Other Ambulatory Visit: Payer: Self-pay

## 2023-09-05 ENCOUNTER — Encounter (HOSPITAL_BASED_OUTPATIENT_CLINIC_OR_DEPARTMENT_OTHER): Payer: Self-pay

## 2023-09-05 DIAGNOSIS — K0889 Other specified disorders of teeth and supporting structures: Secondary | ICD-10-CM | POA: Diagnosis not present

## 2023-09-05 DIAGNOSIS — K047 Periapical abscess without sinus: Secondary | ICD-10-CM | POA: Diagnosis not present

## 2023-09-05 MED ORDER — NAPROXEN 500 MG PO TABS: 500.0000 mg | ORAL_TABLET | Freq: Two times a day (BID) | ORAL | 0 refills | Status: AC

## 2023-09-05 MED ORDER — KETOROLAC TROMETHAMINE 15 MG/ML IJ SOLN
15.0000 mg | Freq: Once | INTRAMUSCULAR | Status: AC
  Administered 2023-09-05: 15 mg via INTRAMUSCULAR
  Filled 2023-09-05: qty 1

## 2023-09-05 NOTE — ED Triage Notes (Signed)
Pt states is he having dental pain bottom right.  Was seen at [facility] and prescribed Clindamycin but states it is not controlling his pain

## 2023-09-05 NOTE — Discharge Instructions (Signed)
It was a pleasure taking care of you today.  As discussed, continue taking your clindamycin as previously prescribed.  I am sending you home with pain medication.  Take as needed for pain.  Call your dentist tomorrow to see if you can schedule an earlier appointment.  Return to the ER for new or worsening symptoms.
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
## 2024-10-24 ENCOUNTER — Encounter (HOSPITAL_BASED_OUTPATIENT_CLINIC_OR_DEPARTMENT_OTHER): Payer: Self-pay | Admitting: Emergency Medicine

## 2024-10-24 ENCOUNTER — Emergency Department (HOSPITAL_BASED_OUTPATIENT_CLINIC_OR_DEPARTMENT_OTHER)

## 2024-10-24 ENCOUNTER — Other Ambulatory Visit: Payer: Self-pay

## 2024-10-24 ENCOUNTER — Emergency Department (HOSPITAL_BASED_OUTPATIENT_CLINIC_OR_DEPARTMENT_OTHER)
Admission: EM | Admit: 2024-10-24 | Discharge: 2024-10-24 | Disposition: A | Discharge status: Home / Self Care | Attending: Emergency Medicine | Admitting: Emergency Medicine

## 2024-10-24 DIAGNOSIS — W2201XA Walked into wall, initial encounter: Secondary | ICD-10-CM | POA: Insufficient documentation

## 2024-10-24 DIAGNOSIS — Y9389 Activity, other specified: Secondary | ICD-10-CM | POA: Insufficient documentation

## 2024-10-24 DIAGNOSIS — M25531 Pain in right wrist: Secondary | ICD-10-CM | POA: Insufficient documentation

## 2024-10-24 MED ORDER — IBUPROFEN 800 MG PO TABS
800.0000 mg | ORAL_TABLET | Freq: Once | ORAL | Status: AC
  Administered 2024-10-24: 800 mg via ORAL
  Filled 2024-10-24: qty 1

## 2024-10-24 NOTE — Discharge Instructions (Signed)
 Evaluate today was reassuring.  Recommend continue conservative treatment at home which includes applying ice 3-4 times a day and you can do ibuprofen and Tylenol  as needed for pain and swelling.  Please follow-up your PCP if your symptoms persist.  You can use a wrist brace as needed for comfort.

## 2024-10-24 NOTE — ED Triage Notes (Signed)
 Pt c/o RT wrist pain x 2d after he was moving stuff out of his house

## 2024-10-24 NOTE — ED Provider Notes (Signed)
 [place] EMERGENCY DEPARTMENT AT [facility] Provider Note   CSN: [id number] Arrival date & time: 10/24/24  1302     Patient presents with: Wrist Pain  HPI Jacob Velazquez is a 33 y.o. male presenting for right wrist pain.  Started 2 days ago.  He states that he was moving some furniture and boxes around the house when he hit his right wrist and distal forearm on the wall.  Since then he notes some swelling and pain primarily on the radial aspect of his wrist and radial distal forearm.  He states that range of motion is still intact.  Denies loss of sensation.  Denies any open wounds in that area.  Took ibuprofen around 6 AM this morning.    Wrist Pain       Prior to Admission medications   Medication Sig Start Date End Date Taking? Authorizing Provider  naproxen  (NAPROSYN ) 500 MG tablet Take 1 tablet (500 mg total) by mouth 2 (two) times daily. 09/05/23   Lorelle Aleck BROCKS, PA-C  predniSONE  (DELTASONE ) 10 MG tablet Take 2 tablets (20 mg total) by mouth daily. 07/06/21   Emelia Sluder, PA-C    Allergies: Patient has no known allergies.    Review of Systems  Updated Vital Signs BP 119/77 (BP Location: Left Arm)   Pulse (!) 55   Temp 97.9 F (36.6 C)   Resp 18   Ht 6' (1.829 m)   Wt 72.6 kg   SpO2 100%   BMI 21.70 kg/m   Physical Exam Constitutional:      Appearance: Normal appearance.  HENT:     Head: Normocephalic.     Nose: Nose normal.  Eyes:     Conjunctiva/sclera: Conjunctivae normal.  Pulmonary:     Effort: Pulmonary effort is normal.  Musculoskeletal:     Right wrist: Swelling present. No effusion, tenderness, bony tenderness or snuff box tenderness. Normal range of motion. Normal pulse.     Left wrist: Normal pulse.     Comments: Swelling noted about the distal radial forearm.  No obvious deformity  Neurological:     Mental Status: He is alert.  Psychiatric:        Mood and Affect: Mood normal.     (all labs ordered are listed, but only  abnormal results are displayed) Labs Reviewed - No data to display  EKG: None  Radiology: DG Forearm Right Result Date: 10/24/2024 EXAM: 2 VIEW(S) XRAY OF THE RIGHT FOREARM 10/24/2024 02:04:00 PM COMPARISON: None available. CLINICAL HISTORY: Pain. FINDINGS: BONES AND JOINTS: No acute fracture. No focal osseous lesion. No joint dislocation. SOFT TISSUES: The soft tissues are unremarkable. IMPRESSION: 1. No significant abnormality. Electronically signed by: Lynwood Seip MD 10/24/2024 02:24 PM EST RP Workstation: [id number]   DG Wrist Complete Right Result Date: 10/24/2024 EXAM: 3 or more VIEW(S) XRAY OF THE RIGHT WRIST 10/24/2024 02:04:00 PM COMPARISON: None available. CLINICAL HISTORY: Pain. FINDINGS: BONES AND JOINTS: No acute fracture. No focal osseous lesion. No joint dislocation. SOFT TISSUES: The soft tissues are unremarkable. IMPRESSION: 1. No significant abnormality. Electronically signed by: Lynwood Seip MD 10/24/2024 02:21 PM EST RP Workstation: [id number]     Procedures   Medications Ordered in the ED  ibuprofen (ADVIL) tablet 800 mg (800 mg Oral Given 10/24/24 1427)                                    Medical  Decision Making Amount and/or Complexity of Data Reviewed Radiology: ordered.  Risk Prescription drug management.   33 year old well-appearing male presenting for right wrist and forearm pain.  Exam notable for swelling to the distal radial right forearm but otherwise reassuring.  No snuffbox tenderness.  No acute osseous abnormalities on x-rays.  Exam findings concerning for possible bruise in that area.  Advised supportive treatment and ibuprofen.  Applied ice.  Gave him a wrist brace to use for comfort.  Advised him to follow-up with his primary care doctor.  Discharged.     Final diagnoses:  Right wrist pain    ED Discharge Orders     None          Lang Norleen POUR, PA-C 10/24/24 1434    Long, Fonda MATSU, MD 10/28/24 1820
